# Patient Record
Sex: Female | Born: 1970
Health system: Southern US, Community
[De-identification: ages and names within clinical notes are randomized; demographics above are authoritative.]

## PROBLEM LIST (undated history)

## (undated) DIAGNOSIS — K219 Gastro-esophageal reflux disease without esophagitis: Secondary | ICD-10-CM

## (undated) DIAGNOSIS — C569 Malignant neoplasm of unspecified ovary: Secondary | ICD-10-CM

## (undated) DIAGNOSIS — F419 Anxiety disorder, unspecified: Secondary | ICD-10-CM

## (undated) HISTORY — PX: ABDOMINAL HYSTERECTOMY: SHX81

## (undated) HISTORY — PX: CHOLECYSTECTOMY: SHX55

## (undated) HISTORY — PX: APPENDECTOMY: SHX54

## (undated) HISTORY — DX: Malignant neoplasm of unspecified ovary: C56.9

---

## 2003-02-03 ENCOUNTER — Encounter: Admission: RE | Admit: 2003-02-03 | Discharge: 2003-03-19 | Payer: Self-pay | Admitting: Orthopedic Surgery

## 2004-09-16 ENCOUNTER — Other Ambulatory Visit: Admission: RE | Admit: 2004-09-16 | Discharge: 2004-09-16 | Payer: Self-pay | Admitting: Family Medicine

## 2008-10-06 ENCOUNTER — Emergency Department (HOSPITAL_COMMUNITY): Admission: EM | Admit: 2008-10-06 | Discharge: 2008-10-06 | Payer: Self-pay | Admitting: Emergency Medicine

## 2008-10-10 ENCOUNTER — Ambulatory Visit: Admission: RE | Admit: 2008-10-10 | Discharge: 2008-10-10 | Payer: Self-pay | Admitting: Gynecology

## 2008-10-28 ENCOUNTER — Inpatient Hospital Stay (HOSPITAL_COMMUNITY): Admission: RE | Admit: 2008-10-28 | Discharge: 2008-10-30 | Payer: Self-pay | Admitting: Obstetrics & Gynecology

## 2008-10-28 ENCOUNTER — Encounter (INDEPENDENT_AMBULATORY_CARE_PROVIDER_SITE_OTHER): Payer: Self-pay | Admitting: Gynecologic Oncology

## 2008-11-18 ENCOUNTER — Ambulatory Visit (HOSPITAL_COMMUNITY): Admission: RE | Admit: 2008-11-18 | Discharge: 2008-11-18 | Payer: Self-pay | Admitting: Gynecologic Oncology

## 2008-11-18 ENCOUNTER — Ambulatory Visit: Admission: RE | Admit: 2008-11-18 | Discharge: 2008-11-18 | Payer: Self-pay | Admitting: Gynecologic Oncology

## 2008-11-24 ENCOUNTER — Ambulatory Visit (HOSPITAL_COMMUNITY): Admission: RE | Admit: 2008-11-24 | Discharge: 2008-11-24 | Payer: Self-pay | Admitting: Gynecologic Oncology

## 2009-06-30 ENCOUNTER — Ambulatory Visit (HOSPITAL_COMMUNITY): Admission: RE | Admit: 2009-06-30 | Discharge: 2009-06-30 | Payer: Self-pay | Admitting: Internal Medicine

## 2009-11-24 IMAGING — CR DG CHEST 2V
2 series · 2 of 2 positions shown · non-contrast
Comparison: None.

CLINICAL DATA: Preop evaluation for pelvic mass.

CHEST - 2 VIEW

[w chest pa]
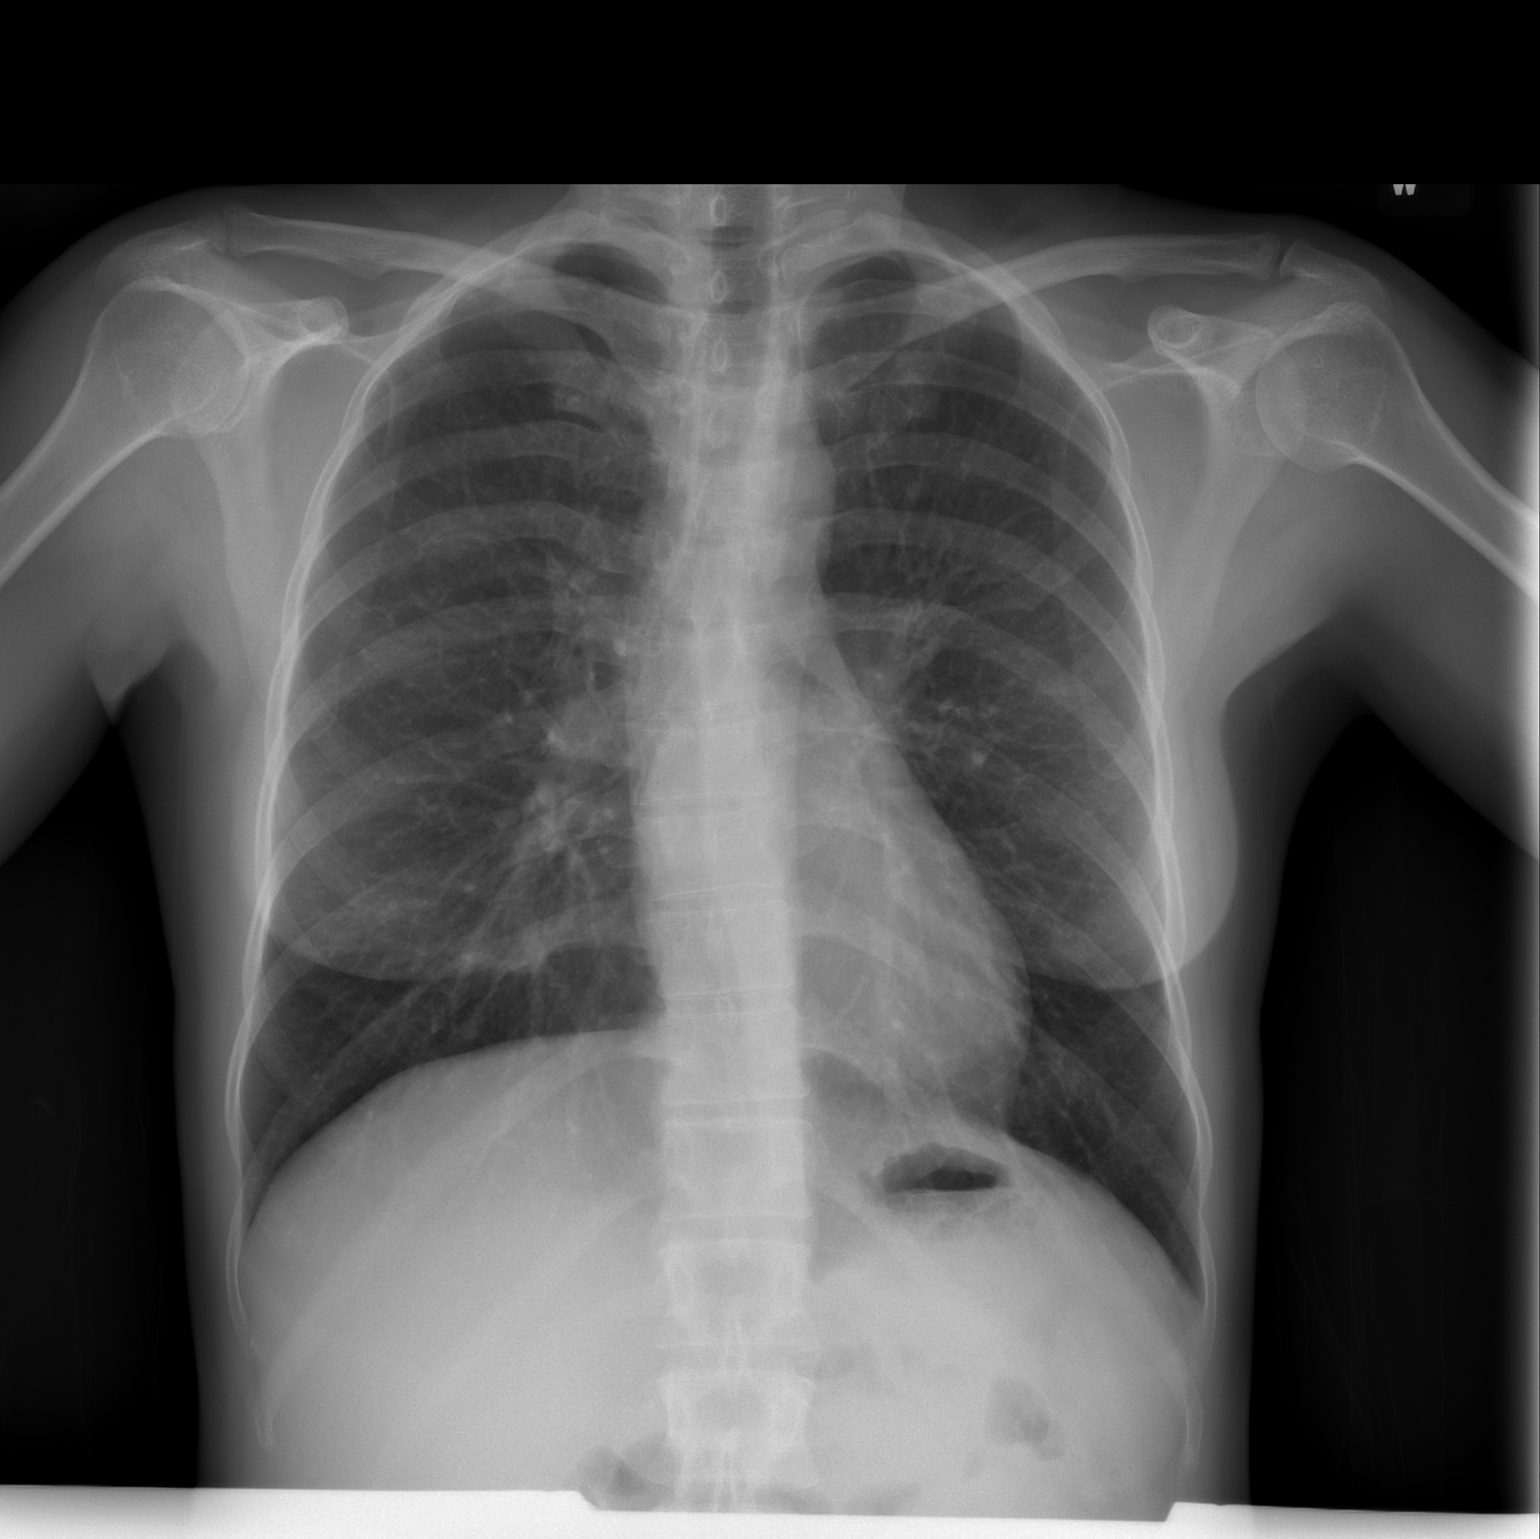

[w chest lat]
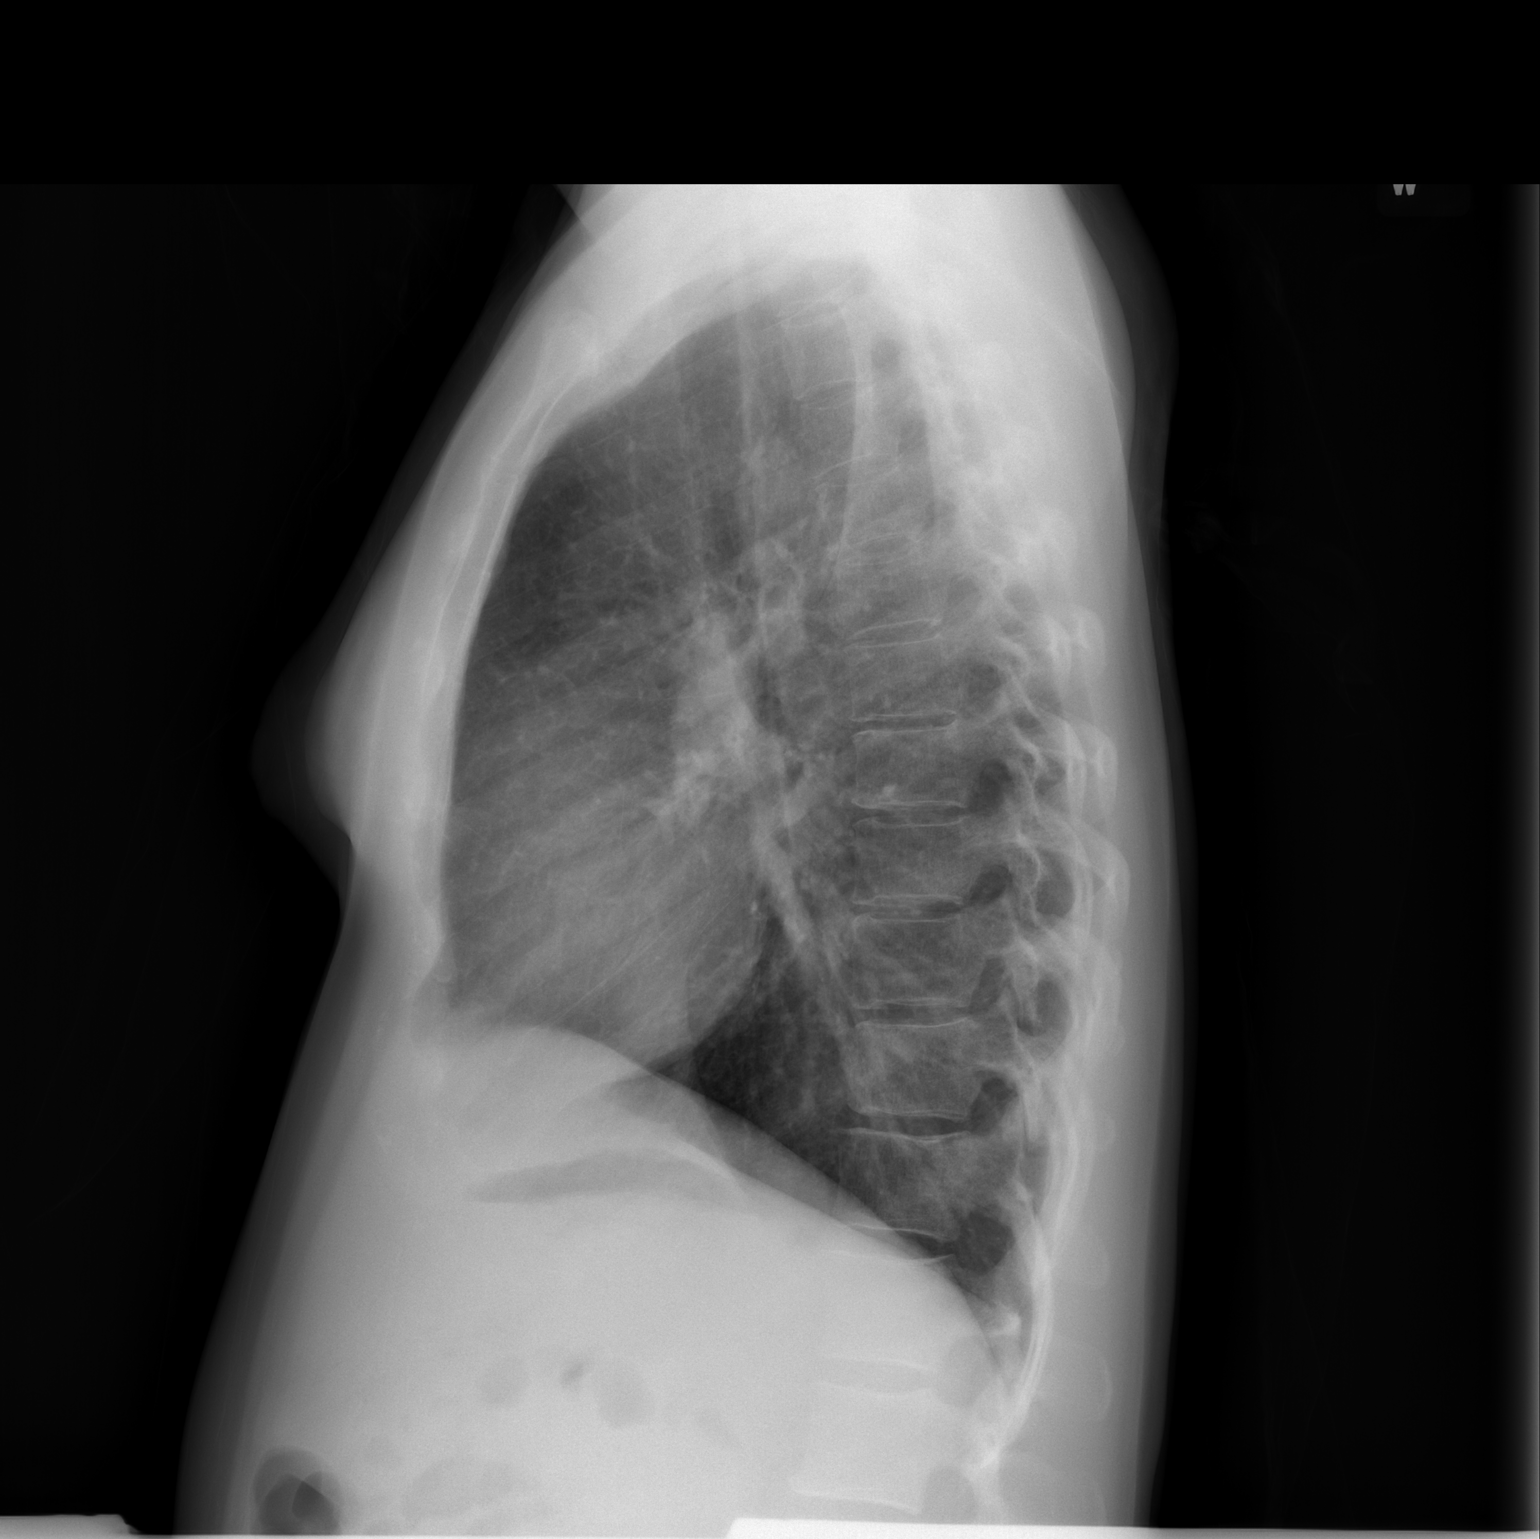

[2 of 2 positions shown; findings below may reference images not displayed]

FINDINGS: The lungs are clear without focal consolidation, edema,
effusion or pneumothorax.  Cardiopericardial silhouette is within
normal limits for size.  Imaged bony structures of the thorax are
intact.
IMPRESSION: No acute cardiopulmonary process.

## 2009-12-21 IMAGING — US US PELVIS COMPLETE
1 series · 14 of 16 positions shown · non-contrast
Comparison: None

CLINICAL DATA: Recent hysterectomy 10/28/2008 for abdominal pelvic
mass demonstrating adenocarcinoma on frozen section, pelvic
pressure, clinical concern for fluid collection

TRANSABDOMINAL ULTRASOUND OF PELVIS
TECHNIQUE: Transabdominal ultrasound examination of the pelvis was
performed including evaluation of the uterus, ovaries, adnexal
regions, and pelvic cul-de-sac.

[Series 1: unknown · 0.27mm/px · 14 of 16 slices shown]
[im 1/16]
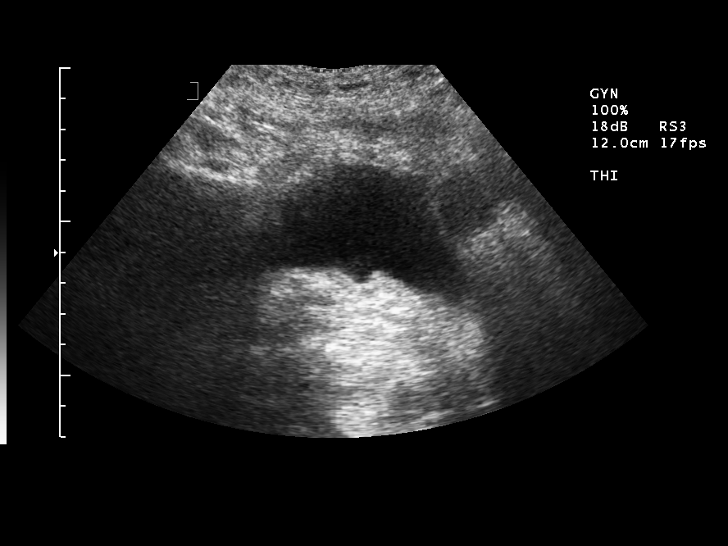
[im 2/16]
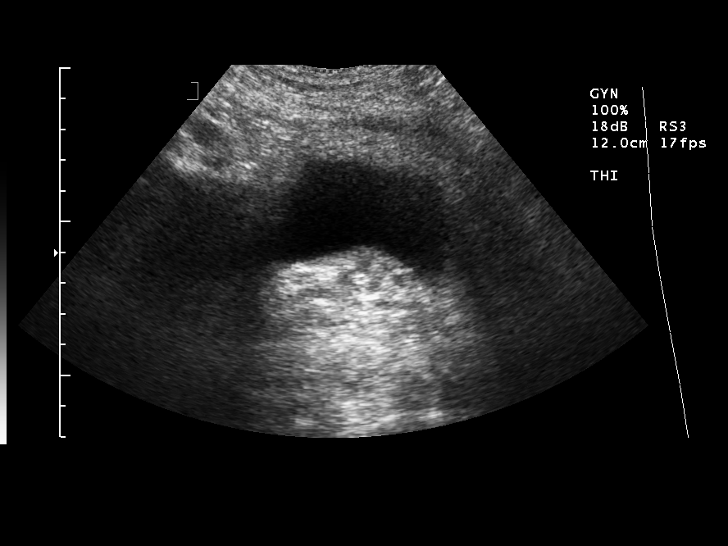
[im 3/16]
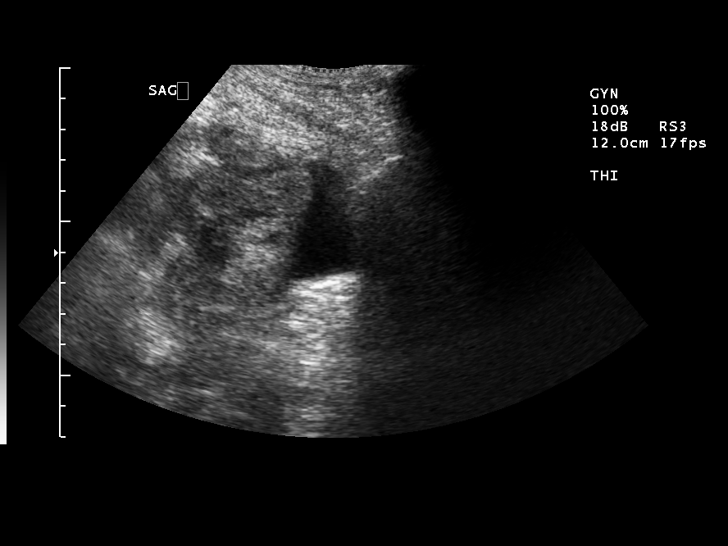
[im 5/16]
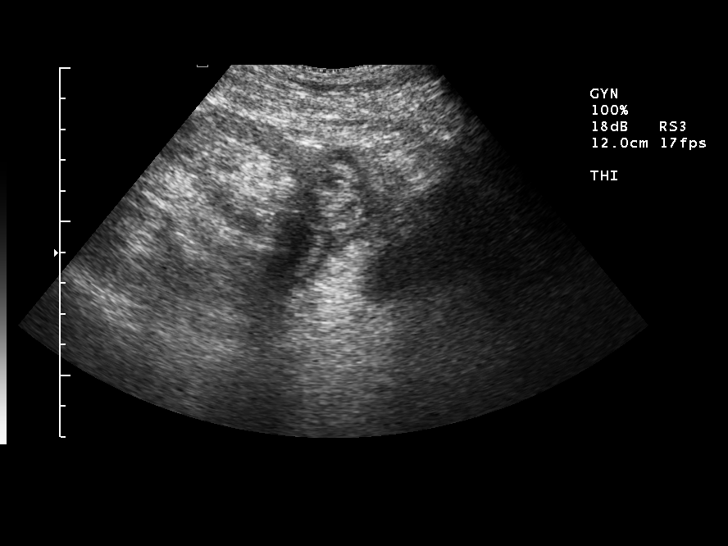
[im 6/16]
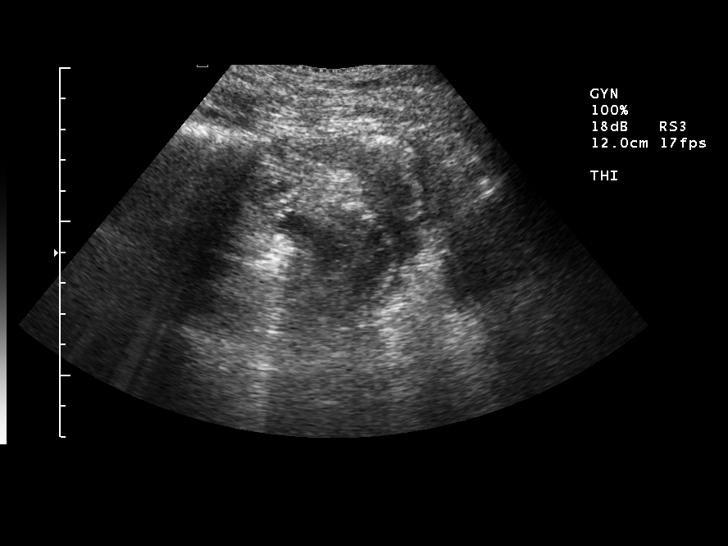
[im 7/16]
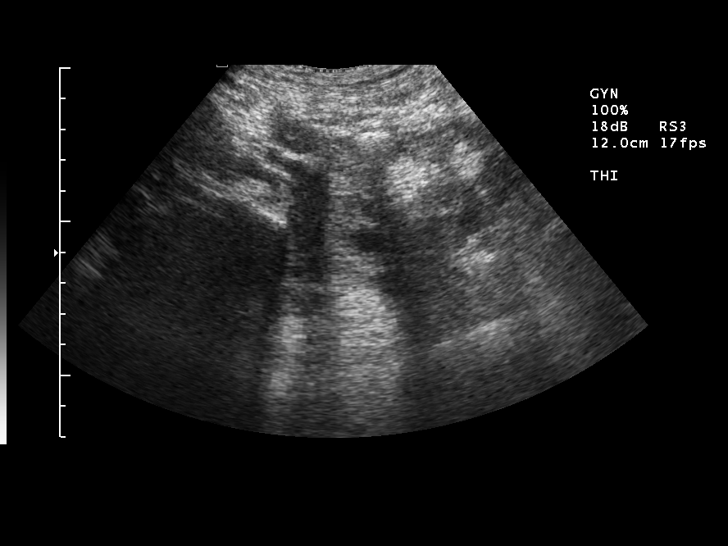
[im 8/16]
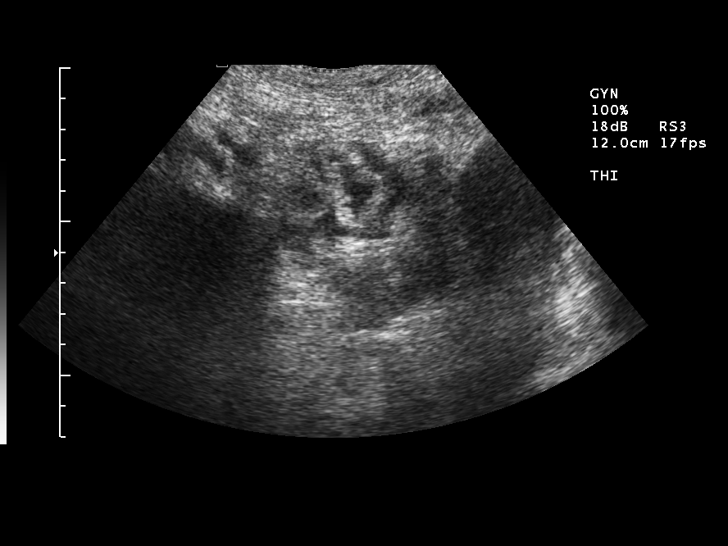
[im 9/16]
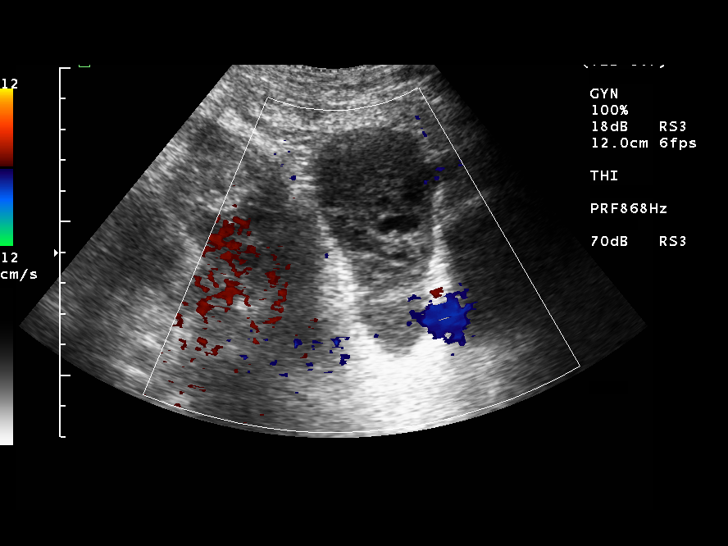
[im 10/16]
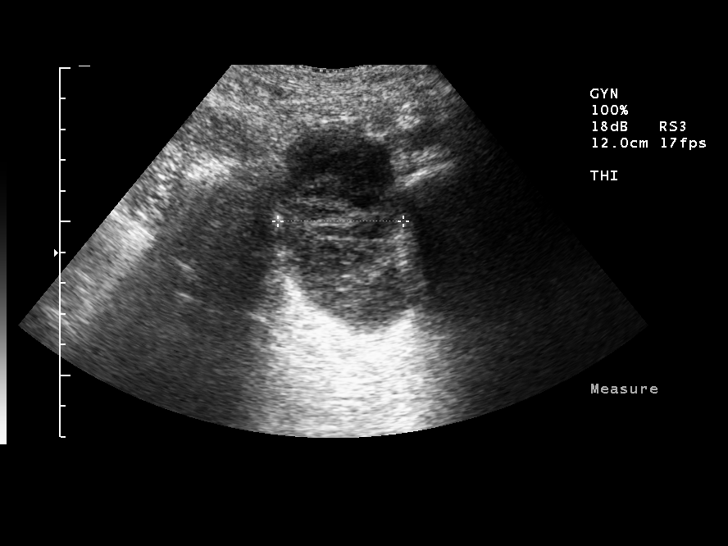
[im 11/16]
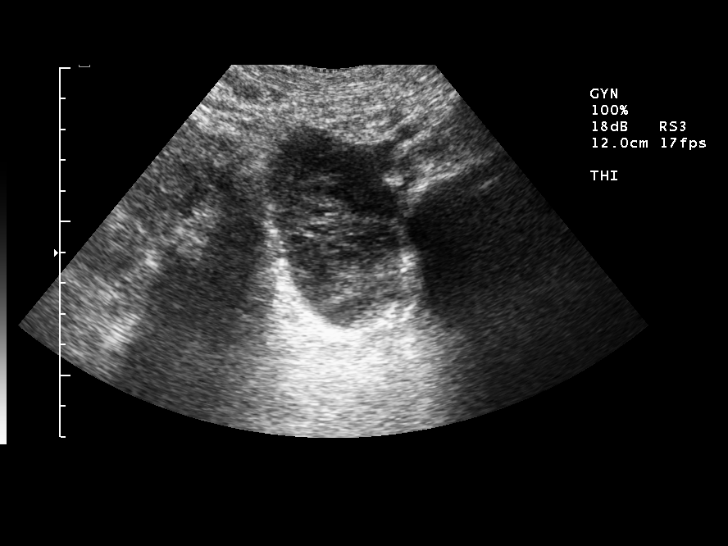
[im 13/16]
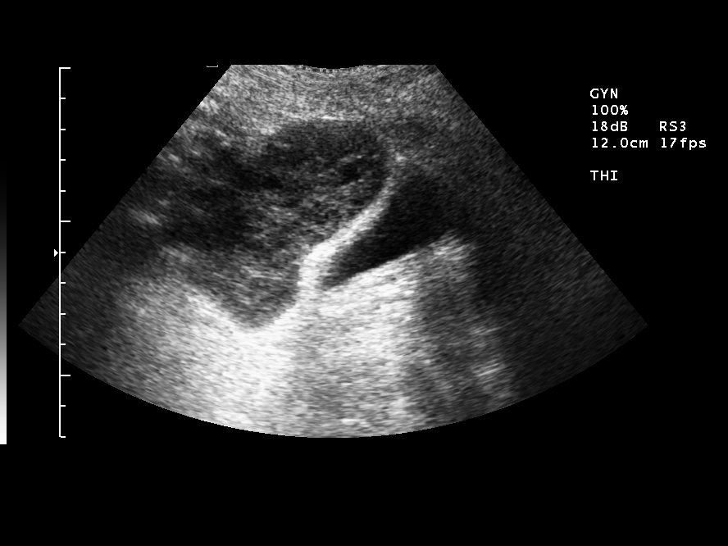
[im 14/16]
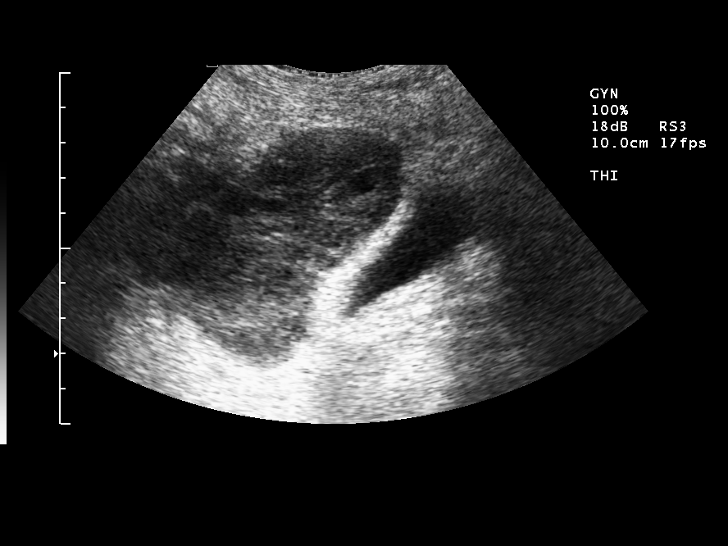
[im 15/16]
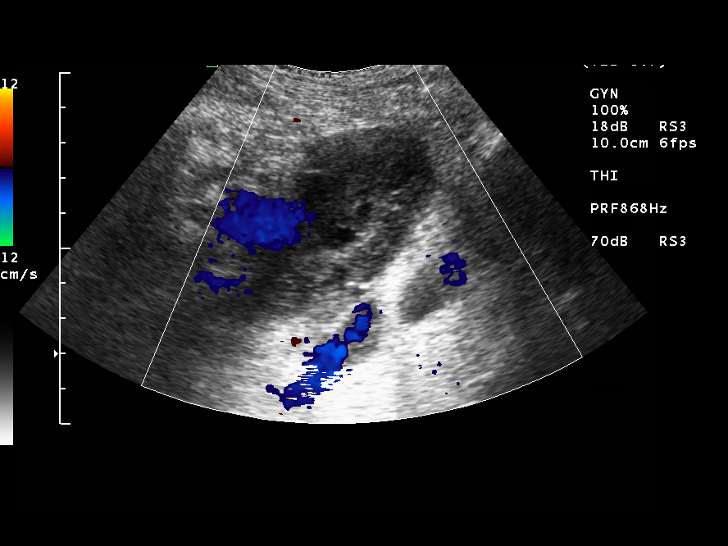
[im 16/16]
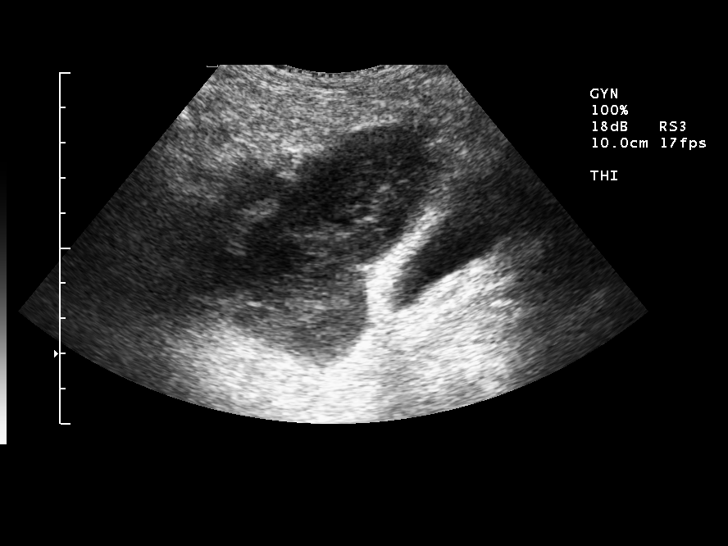

[14 of 16 positions shown; findings below may reference images not displayed]

FINDINGS: No free fluid is identified.  The uterus is surgically
absent.  Neither ovary is identified. There is a 7.6 x 4.1 x 3.7 cm
left adnexal mass which is internally heterogeneous, circumscribed,
with increased through transmission.  No internal color flow is
seen.
IMPRESSION: Left adnexal mass lesion which does not have a typical appearance
for simple postoperative seroma/hematoma or abscess formation.
Given the history of adenocarcinoma, this raises the question of
residual disease.  Complex fluid could have this appearance if it
had undergone interval internal organization since prior surgery.
Consider CT pelvis with IV contrast for further evaluation and to
assist consideration of possible percutaneous drainage, if
clinically indicated.

## 2010-08-02 IMAGING — PT NM PET TUM IMG RESTAG (PS) SKULL BASE T - THIGH
6 series · 25 of 25 positions shown · non-contrast
Comparison: 11/24/2008

CLINICAL DATA: Subsequent treatment strategy for ovarian.

NUCLEAR MEDICINE PET CT INITIAL (PI) SKULL BASE TO THIGH
TECHNIQUE: 15.4 mCi F-18 FDG was injected intravenously via the
right antecubital fossa.  Full-ring PET imaging was performed from
the skull base through the mid-thighs 69  minutes after injection.
CT data was obtained and used for attenuation correction and
anatomic localization only.  (This was not acquired as a diagnostic
CT examination.)
Fasting Blood Glucose:  89

[Series 1: pet ac · axial · 3.3mm · 4.69mm/px · z∈[-850,+20]mm · 5 of 267 slices shown]
[im 1/267]
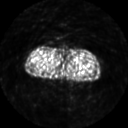
[im 67/267]
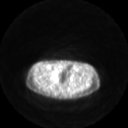
[im 134/267]
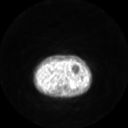
[im 200/267]
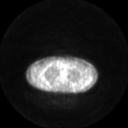
[im 267/267]
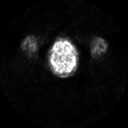

[Series 2: ct images · axial · 3.8mm · 0.98mm/px · z∈[-850,+20]mm · 6 of 267 slices shown]
[im 1/267]
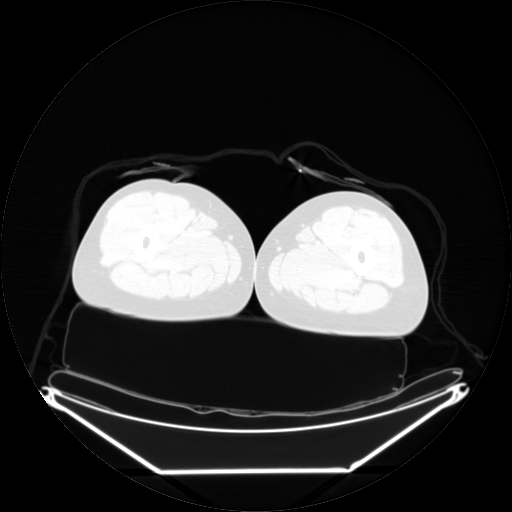
[im 54/267]
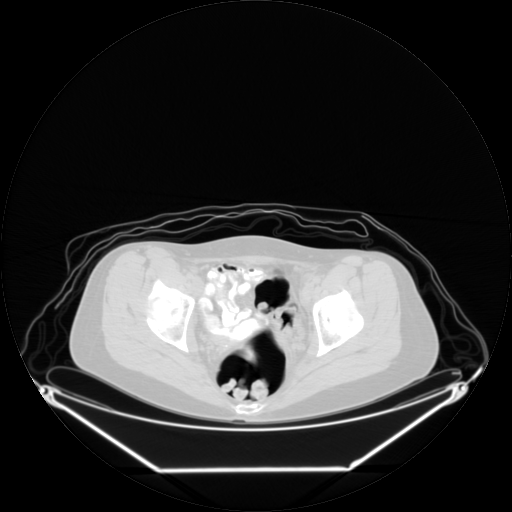
[im 107/267]
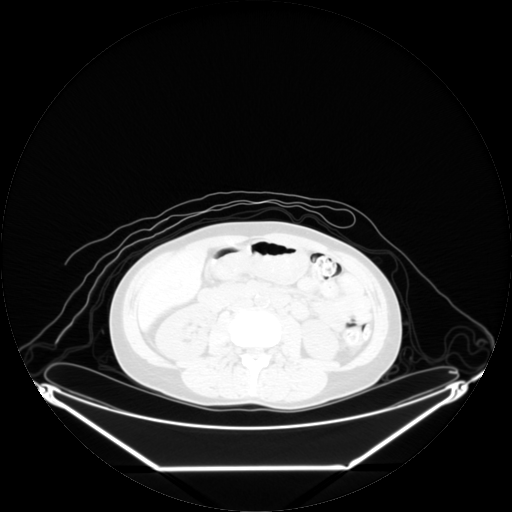
[im 160/267]
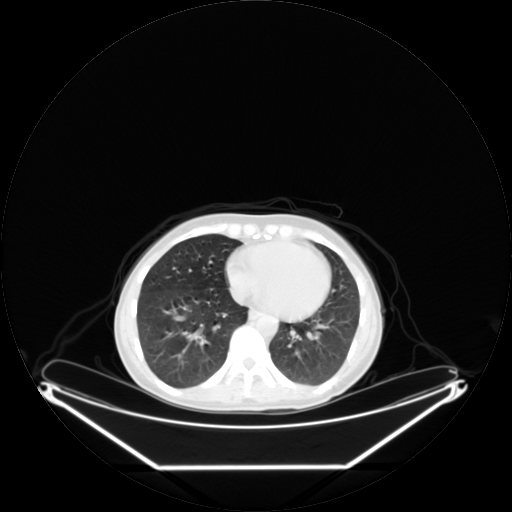
[im 213/267]
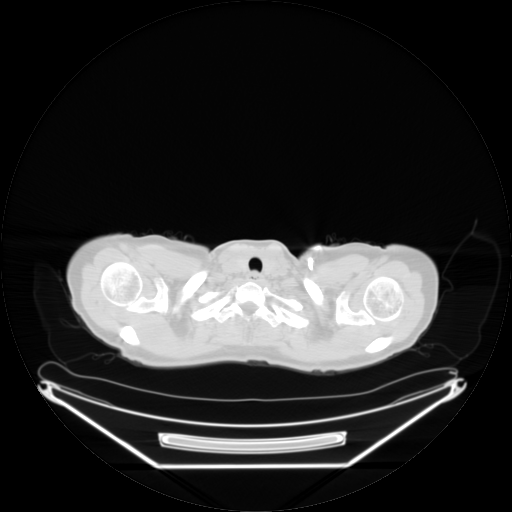
[im 267/267  brain]
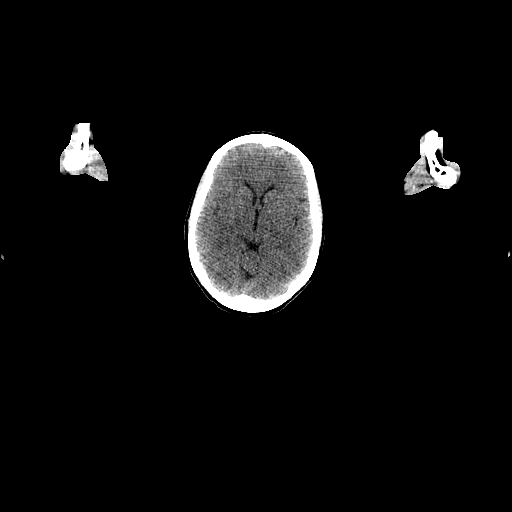

[Series 2: pet nac · axial · 3.3mm · 4.69mm/px · z∈[-850,+20]mm · 6 of 267 slices shown]
[im 1/267]
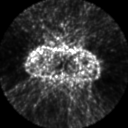
[im 54/267]
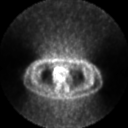
[im 107/267]
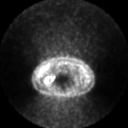
[im 160/267]
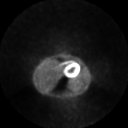
[im 213/267]
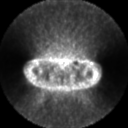
[im 267/267]
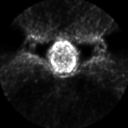

[Series 123: mip · coronal · 3.3mm · 4.69mm/px · 1 of 30 slices shown]
[im 1/30]
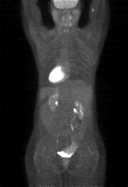

[Series 151: reformatted · axial · 3.3mm · 3.91mm/px · z∈[-850,+20]mm · 6 of 265 slices shown (1 of 2)]
[im 1/265]
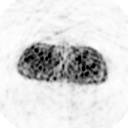
[im 53/265]
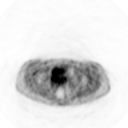
[im 106/265]
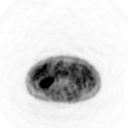
[im 159/265]
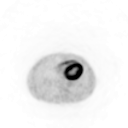
[im 212/265]
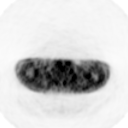
[im 265/265]
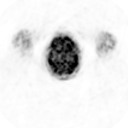

[Series 153: reformatted · coronal · 4.7mm · 6.98mm/px · 1 of 61 slices shown (2 of 2)]
[im 1/61]
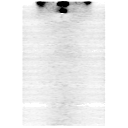

[25 of 25 positions shown; findings below may reference images not displayed]

FINDINGS: No hypermetabolic lymph nodes within the neck.

There are no hypermetabolic supraclavicular or axillary lymph
nodes.

No hypermetabolic mediastinal or hilar lymph nodes identified.

There is no pericardial or pleural effusion.

Tiny nodule in the right upper lobe measures approximately 4 mm,
image 68.  This is too small to characterize.

No hypermetabolic pulmonary nodules or masses noted.

There is no abnormal FDG uptake identified within the liver
parenchyma.

No hypermetabolic uptake within the spleen.

Adrenal glands are negative.

There are no hypermetabolic retroperitoneal or mesenteric lymph
nodes.

No upper abdominal ascites identified.

There is no hypermetabolic peritoneal nodularity within the upper
abdomen.

The previously noted low density fluid collections within the left
hemi pelvis and dependent portion the pelvis have resolved in the
interval.
IMPRESSION: 1.  There is no evidence for residual or recurrent hypermetabolic
tumor.

## 2010-12-19 ENCOUNTER — Encounter (HOSPITAL_COMMUNITY): Payer: Self-pay | Admitting: Internal Medicine

## 2010-12-20 ENCOUNTER — Encounter (HOSPITAL_COMMUNITY): Payer: Self-pay | Admitting: Internal Medicine

## 2011-04-12 NOTE — Consult Note (Signed)
NAMEAUTUMN, PRUITT NO.:  [id number]   MEDICAL RECORD NO.:  [id number]          PATIENT TYPE:  OUT   LOCATION:  GYN                          FACILITY:  [facility]   PHYSICIAN:  De Blanch, M.D.DATE OF BIRTH:  06/11/1971   DATE OF CONSULTATION:  10/10/2008  DATE OF DISCHARGE:                                 CONSULTATION   CHIEF COMPLAINT:  Pelvic mass.   HISTORY OF PRESENT ILLNESS:  A 40 year old white female seen in  consultation at the request of Dr. Ubaldo Glassing regarding management of a  large abdominal pelvic mass.  The patient has had abdominal symptoms for  several months and ultimately presented to the emergency department at  [facility] with pain and fatigue.  A mass was discovered  and evaluated with CT scan which shows bilateral hydronephrosis the  right being greater than the left and a 17 x 19 x 9 cm abdominal pelvic  mass extending to above the umbilicus appearing to originate from the  right adnexal region.  It is felt that the hydronephrosis is secondary  to extrinsic compression.  There is no evidence of bowel obstruction and  ascites cannot be fully determined because of the size of the mass.  There is a small inguinal lymph node measuring less than a centimeter  and a left external iliac lymph node which is 1 cm.   CA-125 is 16.7.  Alpha fetoprotein 4.3.  Serum pregnancy test is  negative.  Remainder of her laboratory tests include a metabolic panel  and complete blood count are normal.  Hemoglobin is 14.2.   PAST MEDICAL HISTORY:  Medical illnesses COPD/asthma, gastroesophageal  reflux disease.   DRUG ALLERGIES:  PENICILLIN (hives), CODEINE (nausea and vomiting), IV  CONTRAST.   PAST SURGICAL HISTORY:  Cesarean section, resection of vaginal septum,  bilateral tubal ligation, cold knife conization.   OBSTETRICAL HISTORY:  Gravida 4.   FAMILY HISTORY:  The patient has an aunt with colon cancer.   SOCIAL HISTORY:  The  patient is separated for many years.  She smokes  one pack per day.  She lives with her young adult daughter.   PHYSICAL EXAM:  VITAL SIGNS:  Height 5 feet 1 inch, weight 125 pounds.  GENERAL:  The patient is a slender white female in no acute distress.  HEENT:  Negative.  NECK:  Supple without thyromegaly.  There is no supraclavicular or  inguinal adenopathy.  ABDOMEN:  The abdomen is soft and nontender.  There is a mass extending  to several centimeters above the umbilicus which is slightly tender to  palpation.  I am unable to detect any ascites or shifting dullness.  PELVIC:  EG/BUS, vagina, bladder and urethra are normal.  Cervix appears  normal.  Uterus is anterior and separate from the mass.  No other masses  are noted.  There is no cul-de-sac nodularity.   IMPRESSION:  Large complex abdominal pelvic mass of questionable  etiology.  I advised the patient and her daughter that surgical  exploration and resection with intraoperative frozen section was most  appropriate mode of management.  They are in agreement with going ahead  with surgery which we will schedule to be performed by Dr. Ronita Hipps on  October 28, 2008.  The risks of surgery were discussed.  In addition, we  discussed that if this is a malignancy she will undergo a TAH-BSO,  omentectomy, pelvic and periaortic lymphadenectomy and multiple  peritoneal biopsies and other debulking as necessary.  She is aware that  if her hydronephrosis is not relieved she may require ureteral stenting.   Further she understands that if she does have a malignancy chemotherapy  may be necessary to advise.  On the other hand if this is benign the  patient would like to preserve her other ovary if it is normal and at  this juncture does not wish to have a hysterectomy either.      De Blanch, M.D.  Electronically Signed     DC/MEDQ  D:  10/10/2008  T:  10/11/2008  Job:  [id number]   cc:   Lebron Conners. Darovsky, M.D.  Fax:  [phone]   Telford Nab, R.N.  [address]  [place], [place] [address]   Su Monks

## 2011-04-12 NOTE — Consult Note (Signed)
[name], [name]               ACCOUNT NO.:  [id number]   MEDICAL RECORD NO.:  [id number]          PATIENT TYPE:  OUT   LOCATION:  XRAY                         FACILITY:  [facility]   PHYSICIAN:  Paola A. Duard Brady, MD    DATE OF BIRTH:  [birth year]   DATE OF CONSULTATION:  11/18/2008  DATE OF DISCHARGE:                                 CONSULTATION   The patient is a 40 year old who underwent exploratory laparotomy TAH-  BSO and appropriate staging October 28, 2008 with Dr. 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  She comes in  today for a postoperative check.  At the time of her surgery, there was  intraoperative spill of the mass was very sick mucinous fluid.  The  bowel was run and was completely negative.  She had a normal appendix.  She is set to see Dr. Ubaldo Glassing for consideration of adjuvant  chemotherapy.  She comes in today for her postoperative check.  She does  complain of some heaviness with voiding.  She states her urine is  somewhat cloudy . She denies any specific dysuria or burning.  She just  states it hurts in her vagina and her rectum, particularly when she is  sitting or sneezing.  She does have some mild nausea in the morning.  She has significant issues with heartburn.  She has gastroesophageal  reflux disease and was supposed to be a AcipHex but has not been able to  be on it as she has lost her insurance.  Now that she is out of  insurance again, she merely does not have a prescription.  She does pass  gas and had regular bowel movements without any significant issues.  She  is otherwise doing quite well.  Denies any fevers or chills.   PHYSICAL EXAMINATION:  CONSTITUTIONAL:  As a well-nourished, alert  female in no acute distress.  VITAL SIGNS:  Weight is 114 pounds down from 125 pounds.  ABDOMEN:  Shows well-healed transverse skin incision.  Abdomen is soft.  PELVIC:  External genitalia is within normal limits.  Vagina is well  epithelialized.  The vaginal cuff is visualized.  There appears to be  some bulging of the vaginal cuff.  It is closed.  Sutures are not  visible.  On bimanual examination there is a mass within the pelvis that  is somewhat tender.  I feel it more posteriorly than anteriorly.  I  cannot appreciate on abdominal exam but on bimanual examination appears  to be 6 to 8 cm.   ASSESSMENT:  64. A 40 year old with a 1C grade 2 endometrioid adenocarcinoma of the      ovary with postoperative masses that could very well just be a      benign fluid collection or seroma.  Will see if radiology could      perform an abdominal ultrasound today.  If they do see a mass, will      see if it can be drained and fluid sent for culture.  2. Will follow up the results of her urine culture  that was sent      today.  3. I will start her on Aciphex.  4. The patient has an appointment with Dr. Ubaldo Glassing tomorrow for      discussion of chemotherapy.  I would expect that she would receive      6 cycles of paclitaxel and carboplatin.      Paola A. Duard Brady, MD  Electronically Signed     PAG/MEDQ  D:  11/18/2008  T:  11/18/2008  Job:  [id number]   cc:   Lebron Conners. Darovsky, M.D.  Fax: [phone]   Telford Nab, R.N.  [address]  [place], [place] [address]

## 2011-04-12 NOTE — Op Note (Signed)
[name], [name]               ACCOUNT NO.:  [id number]   MEDICAL RECORD NO.:  [id number]          PATIENT TYPE:  INP   LOCATION:  1535                         FACILITY:  [facility]   PHYSICIAN:  John T. Kyla Balzarine, M.D.    DATE OF BIRTH:  1971-10-16   DATE OF PROCEDURE:  10/28/2008  DATE OF DISCHARGE:                               OPERATIVE REPORT   SURGEON:  John T. Kyla Balzarine, M.D.   ASSISTANT:  Roseanna Rainbow, M.D.   PREOPERATIVE DIAGNOSIS:  Abdominopelvic mass.   POSTOPERATIVE DIAGNOSIS:  Ovarian carcinoma, at least stage IC.   PROCEDURE:  Exploratory laparotomy with total abdominal hysterectomy and  bilateral salpingo-oophorectomy, multiple peritoneal biopsies,  omentectomy, pelvic and para-aortic bilateral lymphadenectomy,  appendectomy, washings.   DESCRIPTION OF FINDINGS AND INDICATIONS FOR SURGERY:  This 39 year old  woman presented with pain and an 18 x 19 cm abdominopelvic mass.  On her  initial CT scan there was ureteral dilatation bilaterally, right greater  than left.  She was explored through a prior Pfannenstiel incision and a  smooth walled encapsulated tumor was encountered.  There was scant free  peritoneal fluid.  There were adhesions between omentum and sigmoid  colon to the capsule of the ovarian tumor.  We attempted drainage by  pursestringing the capsule of the ovary, puncturing the ovary and  draining the ovary but it contained very thick mucinous fluid and there  was some intra-abdominal spill.  Frozen section returned adenocarcinoma  likely mucinous type.  The bowel was run in its entirety with no focal  lesions and normal appendix.  There were no intraperitoneal lesions  suspicious for peritoneal metastasis, the omentum and viscera were  normal, and there were no pathologically enlarged lymph nodes.  Comprehensive staging was performed.   DESCRIPTION OF PROCEDURE:  Examination under anesthesia was performed  after the patient was prepped and draped  with an indwelling Foley  catheter in low lithotomy position using direct placement stirrups.  Exploration was carried out through a prior transverse skin incision  approximately three fingerbreadths above the symphysis.  The incision  was developed with scalpel and electrocautery, incising the fascia  transversely.  Superior and inferior fascial flaps were developed,  mobilizing fascia off the rectus muscles.  Electrocautery was used for  hemostasis.  The midline was developed bluntly, peritoneum tented up  between two tonsil clamps and entered with a scalpel.  The incision was  extended from symphysis to umbilicus and abdominal contents inspected,  revealing findings described above.  Washings were obtained  approximately 25 mL free fluid.  The large cyst was seen to replace the  right ovary.  We packed lap pads around the abdominal incision, placed a  pursestring suture of zero Vicryl and incised the capsule of the ovary,  draining only 500 mL of mucinous fluid that was encountered.  There was  spill before the ovary could be freed from adhesions to the omentum and  sigmoid colon and delivered through the abdominal incision.  The  peritoneum lateral to the infundibulopelvic ligament was opened, IP  ligament skeletonized, cross clamped, divided and ligated with  free tie  and suture ligature of 2-0 Vicryl.  The utero-ovarian ligament and  fallopian tube were likewise transected at the uterine cornua, suture  ligated and the specimen was handed off for frozen section pathology.  The pelvis was copiously irrigated.  Abdominal contents were manually  and visually inspected, revealing the findings described above.  When  frozen section returned positive for adenocarcinoma, we elected to  perform completion hysterectomy with the LSO and comprehensive staging,  because there were no gross lesions suspicious for metastatic disease.  The greater omentum was removed by mobilizing it off of the  colon and  developing pedicles that were cauterized with electrocautery.  Inspection revealed hemostasis along the omentum and this was submitted  for pathology.  The bowel was then packed out of the pelvis and a  Bookwalter retractor placed.  The uterus was grasped at the cornu with  curved clamps.  The round ligaments bilaterally were divided with  electrocautery.  On each side the posterior broad ligament was  completely opened.  On the right side the pararectal and paravesical  spaces were developed, right sidewall taken down with the uterine  arteries posteriorly and anteriorly, the peritoneal flap developed.  The  bladder was advanced off the lower uterine segment with sharp and blunt  dissection and electrocautery for hemostasis.  The uterine vessels were  skeletonized, cross clamped, divided and suture ligated with 2-0 Vicryl.  On the left side, the ureter was identified and left IP ligament was  skeletonized above the ureter, cross clamped, divided and ligated with  both free tie and suture ligature.  Posterior leaf of the broad ligament  was divided to the uterine vessels above the ureter.  The anterior  peritoneal reflection was opened and bladder flap completely developed.  The left uterine vessels were clamped, divided and suture ligated with 2-  0 Vicryl.  Alternating on the right and left sides additional cardinal  ligament pedicles were developed by clamping adjacent to the cervix,  dividing and suture ligating with 2-0 Vicryl.  Final pedicles entered  the lateral fornices of the vagina and the specimen was amputated,  removing the entire cervix along with the uterus and left tube and  ovary.  The vaginal cuff was closed with locked running zero Vicryl.  Multiple peritoneal biopsies were obtained from the pelvis including  anterior and posterior cul-de-sac, right and left pelvic sidewall; these  specimens were submitted for pathology.   The right and left rectal and  paravesical spaces were fully developed.  Bilateral pelvic lymphadenectomies were performed, initially on the left  and subsequently on the right.  All lymphatic tissue anterior and medial  to the external iliac artery and vein were cleared from the vessels  using sharp and blunt dissection with electrocautery for hemostasis.  Dissection continued along the hypogastric artery into the obturator  space, removing all lymphatic tissue anterior to the obturator nerve.  The dissection continued distally at all locations to the level of the  deep circumflex iliac vein.  Moist lap pads were placed in the  retroperitoneal spaces.   Peritoneum overlying the root of the aorta was opened and using sharp  and blunt dissection, retroperitoneum developed.  The right ureter was  mobilized laterally and lymphatic tissue anterior and lateral to the  right common iliac artery and aorta was mobilized off of the veins to  the psoas muscle laterally.  The dissection extended above the  bifurcation of the aorta to approximately the level of  the duodenum and  insertion of the right ovarian vein.  On the left side, retroperitoneal  spaces were developed, ureter identified and retracted anterolaterally.  The lymphatic tissue anterior and lateral to the distal common iliac  artery distally to the common iliac artery and proximally to the level  of the IMA were cleared off of the great vessels using sharp and blunt  dissection with electrocautery for hemostasis.  The operative sites were  hemostatic.  The cecum was mobilized and peritoneal biopsies were  obtained from the right and left pericolic gutters and blind biopsies  from the mesentery of the distal small bowel.  Incidental appendectomy  was performed because of the association between mucinous carcinomas of  the ovary and occult appendiceal malignancies.  The mesentery of the  appendix was developed into pedicles that were controlled with  electrocautery.   The base of the appendix was crushed between two  clamps, divided with the scalpel and controlled with a transfixing  suture.  Copious irrigation was performed.  The diaphragms were each  biopsied using a long parametrial clamp to strip the peritoneum from  each diaphragm.  At the conclusion of the procedure, all lap pads and  sponges were removed and operative sites inspected for hemostasis;  additional hemostasis was achieved where necessary with electrocautery.  The abdominal wall was closed in layers using running 2-0 Vicryl to  reapproximate the posterior sheath, peritoneum and rectus bellies in the  midline, running zero Vicryl to close fascia and running subcuticular 3-  0 Vicryl to close the skin.  The patient tolerated the procedure well  and was returned to the recovery room in stable condition.   ESTIMATED BLOOD LOSS:  150 mL.   TRANSFUSIONS:  None.   DRAINS AND PACKS:  Foley to dependent drainage.   COUNTS:  Sponge and instrument counts correct x2.   PATHOLOGY SPECIMENS:  Peritoneal washings, uterus with cervix, bilateral  tubes and ovaries, right and left pelvic sidewall biopsies, anterior and  posterior cul-de-sac biopsies, right and left pelvic lymph nodes, right  and left common iliac lymph nodes, right and left aortic lymph nodes,  right and left paracolic gutter peritoneum, mesentery of small  intestine, appendix, right and left diaphragms.      John T. Kyla Balzarine, M.D.  Electronically Signed     JTS/MEDQ  D:  10/28/2008  T:  10/29/2008  Job:  [id number]   cc:   Telford Nab, R.N.  [address]  [place], [place] [address]   Roseanna Rainbow, M.D.  Fax: [phone]

## 2011-04-15 NOTE — Discharge Summary (Signed)
NAMEBEYLA, [name]               ACCOUNT NO.:  [id number]   MEDICAL RECORD NO.:  [id number]          PATIENT TYPE:  INP   LOCATION:  1535                         FACILITY:  [facility]   PHYSICIAN:  Roseanna Rainbow, M.D.DATE OF BIRTH:  16-Oct-1971   DATE OF ADMISSION:  10/28/2008  DATE OF DISCHARGE:  10/30/2008                               DISCHARGE SUMMARY   CHIEF COMPLAINT:  The patient is a 40 year old who presents for  operative management of a large abdominal pelvic mass.  Please see the  dictated history and physical.   HOSPITAL COURSE:  The patient was admitted.  She underwent a total  abdominal hysterectomy, bilateral salpingo-oophorectomy, omentectomy,  multiple biopsies, pelvic and periaortic lymph lymphadenectomies, and an  appendectomy.  Please see the dictated operative summary.  On  postoperative day 1, her hemoglobin was 11.5.  She was complaining of a  cough and chest tightness.  The patient has a history of COPD.  Respiratory therapy was ordered.  She was started on Zithromax p.o.  Her  diet was gradually advanced.  She was subsequently discharged to home.   DISCHARGE DIAGNOSIS:  Ovarian adenocarcinoma, stage IC.   PROCEDURES:  1. Total abdominal hysterectomy and bilateral salpingo-oophorectomy.  2. Omentectomy.  3. Multiple biopsies.  4. Pelvic and periaortic lymphadenectomies.  5. Appendectomy.   CONDITION:  Stable.   DIET:  Regular.   ACTIVITY:  Progressive activity, pelvic rest.   MEDICATIONS:  Include:  1. Zithromax.  2. Percocet.   DISPOSITION:  The patient was to follow up with Dr. Stanford Breed on  November 25, 2008, at the GYN oncology office at 1 p.m.      Roseanna Rainbow, M.D.  Electronically Signed     LAJ/MEDQ  D:  12/09/2008  T:  12/09/2008  Job:  [id number]   cc:   Telford Nab, R.N.  [address]  [place], [place] [address]   Dr. _____  Fax: [phone]   Dr. Molly Maduro _____

## 2011-08-30 LAB — COMPREHENSIVE METABOLIC PANEL
AST: 14 U/L (ref 0–37)
Albumin: 2.7 g/dL — ABNORMAL LOW (ref 3.5–5.2)
Calcium: 8.9 mg/dL (ref 8.4–10.5)
Creatinine, Ser: 0.55 mg/dL (ref 0.4–1.2)
GFR calc Af Amer: >60 mL/min (ref >60)
GFR calc non Af Amer: >60 mL/min (ref >60)

## 2011-08-30 LAB — DIFFERENTIAL
Basophils Absolute: 0.8 10*3/uL — ABNORMAL HIGH (ref 0.0–0.1)
Basophils Relative: 5 % — ABNORMAL HIGH (ref 0–1)
Eosinophils Absolute: 0.2 10*3/uL (ref 0.0–0.7)
Eosinophils Relative: 1 % (ref 0–5)
Monocytes Absolute: 0.5 10*3/uL (ref 0.1–1.0)

## 2011-08-30 LAB — CBC
MCHC: 33.3 g/dL (ref 30.0–36.0)
MCV: 92.1 fL (ref 78.0–100.0)
Platelets: 658 10*3/uL — ABNORMAL HIGH (ref 150–400)
RDW: 12.9 % (ref 11.5–15.5)

## 2011-08-30 LAB — URINALYSIS, ROUTINE W REFLEX MICROSCOPIC
Glucose, UA: NEGATIVE mg/dL
Hgb urine dipstick: NEGATIVE
Ketones, ur: NEGATIVE mg/dL
Protein, ur: NEGATIVE mg/dL
pH: 6.5 (ref 5.0–8.0)

## 2011-09-02 LAB — URINALYSIS, ROUTINE W REFLEX MICROSCOPIC
Bilirubin Urine: NEGATIVE
Glucose, UA: NEGATIVE mg/dL
Hgb urine dipstick: NEGATIVE
Ketones, ur: NEGATIVE mg/dL
Nitrite: NEGATIVE
Specific Gravity, Urine: 1.016 (ref 1.005–1.030)
pH: 7 (ref 5.0–8.0)

## 2011-09-02 LAB — URINE CULTURE
Colony Count: NO GROWTH
Culture: NO GROWTH

## 2011-09-02 LAB — DIFFERENTIAL
Basophils Absolute: 0.1 10*3/uL (ref 0.0–0.1)
Eosinophils Relative: 1 % (ref 0–5)
Lymphocytes Relative: 16 % (ref 12–46)
Monocytes Absolute: 0.6 10*3/uL (ref 0.1–1.0)
Monocytes Relative: 5 % (ref 3–12)
Neutro Abs: 8.8 10*3/uL — ABNORMAL HIGH (ref 1.7–7.7)

## 2011-09-02 LAB — BASIC METABOLIC PANEL
BUN: 4 mg/dL — ABNORMAL LOW (ref 6–23)
Calcium: 8.3 mg/dL — ABNORMAL LOW (ref 8.4–10.5)
GFR calc non Af Amer: >60 mL/min (ref >60)
Potassium: 4.4 mEq/L (ref 3.5–5.1)
Sodium: 133 mEq/L — ABNORMAL LOW (ref 135–145)

## 2011-09-02 LAB — CBC
HCT: 34.3 % — ABNORMAL LOW (ref 36.0–46.0)
HCT: 36 % (ref 36.0–46.0)
Hemoglobin: 11.5 g/dL — ABNORMAL LOW (ref 12.0–15.0)
Hemoglobin: 11.8 g/dL — ABNORMAL LOW (ref 12.0–15.0)
Platelets: 631 10*3/uL — ABNORMAL HIGH (ref 150–400)
RBC: 3.88 MIL/uL (ref 3.87–5.11)
RDW: 13.7 % (ref 11.5–15.5)
RDW: 13.8 % (ref 11.5–15.5)
WBC: 16.1 10*3/uL — ABNORMAL HIGH (ref 4.0–10.5)

## 2011-09-02 LAB — TYPE AND SCREEN: Antibody Screen: NEGATIVE

## 2011-09-02 LAB — ABO/RH: ABO/RH(D): B POS

## 2012-05-04 ENCOUNTER — Encounter: Payer: Self-pay | Admitting: Internal Medicine

## 2012-05-04 DIAGNOSIS — Z5111 Encounter for antineoplastic chemotherapy: Secondary | ICD-10-CM

## 2012-05-04 DIAGNOSIS — C569 Malignant neoplasm of unspecified ovary: Secondary | ICD-10-CM

## 2012-06-25 ENCOUNTER — Encounter: Payer: Self-pay | Admitting: Internal Medicine

## 2012-06-28 DIAGNOSIS — C569 Malignant neoplasm of unspecified ovary: Secondary | ICD-10-CM

## 2012-06-28 DIAGNOSIS — Z452 Encounter for adjustment and management of vascular access device: Secondary | ICD-10-CM

## 2012-07-19 ENCOUNTER — Encounter: Payer: Self-pay | Admitting: Internal Medicine

## 2012-07-19 DIAGNOSIS — C569 Malignant neoplasm of unspecified ovary: Secondary | ICD-10-CM

## 2012-07-19 DIAGNOSIS — R911 Solitary pulmonary nodule: Secondary | ICD-10-CM

## 2012-08-30 DIAGNOSIS — Z452 Encounter for adjustment and management of vascular access device: Secondary | ICD-10-CM

## 2012-08-30 DIAGNOSIS — C569 Malignant neoplasm of unspecified ovary: Secondary | ICD-10-CM

## 2012-10-11 DIAGNOSIS — C569 Malignant neoplasm of unspecified ovary: Secondary | ICD-10-CM

## 2012-10-11 DIAGNOSIS — Z452 Encounter for adjustment and management of vascular access device: Secondary | ICD-10-CM

## 2012-11-23 DIAGNOSIS — C569 Malignant neoplasm of unspecified ovary: Secondary | ICD-10-CM

## 2012-11-23 DIAGNOSIS — Z452 Encounter for adjustment and management of vascular access device: Secondary | ICD-10-CM

## 2013-01-03 DIAGNOSIS — C569 Malignant neoplasm of unspecified ovary: Secondary | ICD-10-CM

## 2013-01-03 DIAGNOSIS — Z452 Encounter for adjustment and management of vascular access device: Secondary | ICD-10-CM

## 2013-02-15 DIAGNOSIS — C569 Malignant neoplasm of unspecified ovary: Secondary | ICD-10-CM

## 2013-02-15 DIAGNOSIS — Z452 Encounter for adjustment and management of vascular access device: Secondary | ICD-10-CM

## 2013-03-28 DIAGNOSIS — C569 Malignant neoplasm of unspecified ovary: Secondary | ICD-10-CM

## 2013-03-28 DIAGNOSIS — Z452 Encounter for adjustment and management of vascular access device: Secondary | ICD-10-CM

## 2013-04-17 ENCOUNTER — Encounter: Payer: Self-pay | Admitting: Internal Medicine

## 2013-04-17 DIAGNOSIS — Z8543 Personal history of malignant neoplasm of ovary: Secondary | ICD-10-CM

## 2013-05-09 DIAGNOSIS — C569 Malignant neoplasm of unspecified ovary: Secondary | ICD-10-CM

## 2013-05-09 DIAGNOSIS — Z87891 Personal history of nicotine dependence: Secondary | ICD-10-CM

## 2013-06-20 DIAGNOSIS — Z452 Encounter for adjustment and management of vascular access device: Secondary | ICD-10-CM

## 2013-06-20 DIAGNOSIS — C569 Malignant neoplasm of unspecified ovary: Secondary | ICD-10-CM

## 2013-08-16 DIAGNOSIS — Z452 Encounter for adjustment and management of vascular access device: Secondary | ICD-10-CM

## 2013-08-16 DIAGNOSIS — C569 Malignant neoplasm of unspecified ovary: Secondary | ICD-10-CM

## 2013-10-11 DIAGNOSIS — C569 Malignant neoplasm of unspecified ovary: Secondary | ICD-10-CM

## 2013-10-11 DIAGNOSIS — Z452 Encounter for adjustment and management of vascular access device: Secondary | ICD-10-CM

## 2013-10-30 DIAGNOSIS — C569 Malignant neoplasm of unspecified ovary: Secondary | ICD-10-CM

## 2014-06-11 ENCOUNTER — Other Ambulatory Visit (HOSPITAL_COMMUNITY): Payer: Self-pay | Admitting: Oncology

## 2014-06-11 DIAGNOSIS — C569 Malignant neoplasm of unspecified ovary: Secondary | ICD-10-CM | POA: Insufficient documentation

## 2014-07-23 ENCOUNTER — Other Ambulatory Visit: Payer: Self-pay

## 2014-12-05 ENCOUNTER — Ambulatory Visit (HOSPITAL_COMMUNITY)
Admission: RE | Admit: 2014-12-05 | Discharge: 2014-12-05 | Disposition: A | Discharge status: Home / Self Care | Payer: Self-pay | Source: Ambulatory Visit | Attending: Oncology | Admitting: Oncology

## 2014-12-05 DIAGNOSIS — C569 Malignant neoplasm of unspecified ovary: Secondary | ICD-10-CM

## 2014-12-05 DIAGNOSIS — R918 Other nonspecific abnormal finding of lung field: Secondary | ICD-10-CM | POA: Insufficient documentation

## 2014-12-05 DIAGNOSIS — Z8543 Personal history of malignant neoplasm of ovary: Secondary | ICD-10-CM | POA: Insufficient documentation

## 2014-12-05 DIAGNOSIS — Z87891 Personal history of nicotine dependence: Secondary | ICD-10-CM | POA: Insufficient documentation

## 2014-12-05 DIAGNOSIS — I700 Atherosclerosis of aorta: Secondary | ICD-10-CM | POA: Insufficient documentation

## 2014-12-05 DIAGNOSIS — J439 Emphysema, unspecified: Secondary | ICD-10-CM | POA: Insufficient documentation

## 2015-12-16 DIAGNOSIS — F419 Anxiety disorder, unspecified: Secondary | ICD-10-CM | POA: Insufficient documentation

## 2016-07-14 ENCOUNTER — Encounter (HOSPITAL_COMMUNITY): Payer: 59

## 2016-07-15 ENCOUNTER — Encounter (HOSPITAL_COMMUNITY): Payer: 59 | Attending: Oncology

## 2016-07-15 ENCOUNTER — Encounter (HOSPITAL_COMMUNITY): Payer: Self-pay

## 2016-07-15 VITALS — BP 147/99 | HR 80 | Temp 97.9°F | Resp 20

## 2016-07-15 DIAGNOSIS — C569 Malignant neoplasm of unspecified ovary: Secondary | ICD-10-CM

## 2016-07-15 DIAGNOSIS — Z95828 Presence of other vascular implants and grafts: Secondary | ICD-10-CM

## 2016-07-15 DIAGNOSIS — Z452 Encounter for adjustment and management of vascular access device: Secondary | ICD-10-CM | POA: Diagnosis not present

## 2016-07-15 MED ORDER — SODIUM CHLORIDE 0.9% FLUSH: 10.0000 mL | INTRAVENOUS | Status: DC | PRN

## 2016-07-15 MED ORDER — HEPARIN SOD (PORK) LOCK FLUSH 100 UNIT/ML IV SOLN
500.0000 [IU] | Freq: Once | INTRAVENOUS | Status: AC
  Administered 2016-07-15: 500 [IU] via INTRAVENOUS
  Filled 2016-07-15: qty 5

## 2016-07-15 NOTE — Progress Notes (Signed)
[name] presented for Portacath access and flush. Proper placement of portacath confirmed by CXR. Portacath located lt chest wall accessed with  H 20 needle. Good blood return present. Portacath flushed with 33ml NS and 500U/77ml Heparin and needle removed intact. Procedure without incident. Patient tolerated procedure well.

## 2016-07-15 NOTE — Progress Notes (Signed)
Pt discharged ambulatory and stable.

## 2016-09-08 ENCOUNTER — Encounter (HOSPITAL_COMMUNITY): Payer: 59 | Attending: Oncology

## 2016-09-08 VITALS — BP 132/88 | HR 80 | Temp 98.0°F | Resp 16

## 2016-09-08 DIAGNOSIS — Z452 Encounter for adjustment and management of vascular access device: Secondary | ICD-10-CM

## 2016-09-08 DIAGNOSIS — C569 Malignant neoplasm of unspecified ovary: Secondary | ICD-10-CM | POA: Diagnosis not present

## 2016-09-08 DIAGNOSIS — Z95828 Presence of other vascular implants and grafts: Secondary | ICD-10-CM | POA: Insufficient documentation

## 2016-09-08 MED ORDER — HEPARIN SOD (PORK) LOCK FLUSH 100 UNIT/ML IV SOLN
500.0000 [IU] | Freq: Once | INTRAVENOUS | Status: AC
  Administered 2016-09-08: 500 [IU] via INTRAVENOUS
  Filled 2016-09-08: qty 5

## 2016-09-08 MED ORDER — SODIUM CHLORIDE 0.9% FLUSH
20.0000 mL | INTRAVENOUS | Status: DC | PRN
  Administered 2016-09-08: 20 mL via INTRAVENOUS
  Filled 2016-09-08: qty 20

## 2016-09-08 NOTE — Patient Instructions (Signed)
[facility] at [facility] Discharge Instructions  RECOMMENDATIONS MADE BY THE CONSULTANT AND ANY TEST RESULTS WILL BE SENT TO YOUR REFERRING PHYSICIAN.  Port flush today.    Thank you for choosing [facility] at [facility] to provide your oncology and hematology care.  To afford each patient quality time with our provider, please arrive at least 15 minutes before your scheduled appointment time.   Beginning January 23rd 2017 lab work for the Cancer Center will be done in the  Main lab at [place] on 1st floor. If you have a lab appointment with the Cancer Center please come in thru the  Main Entrance and check in at the main information desk  You need to re-schedule your appointment should you arrive 10 or more minutes late.  We strive to give you quality time with our providers, and arriving late affects you and other patients whose appointments are after yours.  Also, if you no show three or more times for appointments you may be dismissed from the clinic at the providers discretion.     Again, thank you for choosing [facility].  Our hope is that these requests will decrease the amount of time that you wait before being seen by our physicians.       _____________________________________________________________  Should you have questions after your visit to [facility], please contact our office at [phone] between the hours of 8:30 a.m. and 4:30 p.m.  Voicemails left after 4:30 p.m. will not be returned until the following business day.  For prescription refill requests, have your pharmacy contact our office.         Resources For Cancer Patients and their Caregivers ? American Cancer Society: Can assist with transportation, wigs, general needs, runs Look Good Feel Better.        1-888-227-6333 ? Cancer Care: Provides financial assistance, online support groups, medication/co-pay assistance.   1-800-813-HOPE (4673) ? [facility] Assists [place] cancer patients and their families through emotional , educational and financial support.  336-427-4357 ? [place] DSS Where to apply for food stamps, Medicaid and utility assistance. 336-342-1394 ? RCATS: Transportation to medical appointments. 336-347-2287 ? Social Security Administration: May apply for disability if have a Stage IV cancer. 336-342-7796 1-800-772-1213 ? [place] Aging, Disability and Transit Services: Assists with nutrition, care and transit needs. 336-349-2343  Cancer Center Support Programs: @10RELATIVEDAYS@ > Cancer Support Group  2nd Tuesday of the month 1pm-2pm, Journey Room  > Creative Journey  3rd Tuesday of the month 1130am-1pm, Journey Room  > Look Good Feel Better  1st Wednesday of the month 10am-12 noon, Journey Room (Call American Cancer Society to register 1-800-395-5775)    

## 2016-09-08 NOTE — Progress Notes (Signed)
[name] presented for Portacath access and flush. Proper placement of portacath confirmed by CXR. Portacath located left chest wall accessed with  H 20 needle. Good blood return present. Portacath flushed with 50ml NS and 500U/86ml Heparin and needle removed intact. Procedure without incident. Patient tolerated procedure well.

## 2016-11-03 ENCOUNTER — Encounter (HOSPITAL_COMMUNITY): Payer: 59 | Attending: Oncology

## 2016-11-18 ENCOUNTER — Encounter (HOSPITAL_COMMUNITY): Payer: Self-pay

## 2016-11-18 ENCOUNTER — Encounter (HOSPITAL_COMMUNITY): Payer: 59 | Attending: Oncology

## 2016-11-18 VITALS — BP 141/88 | HR 86 | Temp 97.8°F | Resp 16

## 2016-11-18 DIAGNOSIS — Z452 Encounter for adjustment and management of vascular access device: Secondary | ICD-10-CM | POA: Diagnosis not present

## 2016-11-18 DIAGNOSIS — Z95828 Presence of other vascular implants and grafts: Secondary | ICD-10-CM

## 2016-11-18 DIAGNOSIS — C569 Malignant neoplasm of unspecified ovary: Secondary | ICD-10-CM

## 2016-11-18 MED ORDER — SODIUM CHLORIDE 0.9% FLUSH
10.0000 mL | INTRAVENOUS | Status: DC | PRN
  Administered 2016-11-18: 10 mL via INTRAVENOUS
  Filled 2016-11-18: qty 10

## 2016-11-18 MED ORDER — HEPARIN SOD (PORK) LOCK FLUSH 100 UNIT/ML IV SOLN
500.0000 [IU] | Freq: Once | INTRAVENOUS | Status: AC
  Administered 2016-11-18: 500 [IU] via INTRAVENOUS
  Filled 2016-11-18: qty 5

## 2016-11-18 NOTE — Patient Instructions (Signed)
[place] at [facility] Discharge Instructions  RECOMMENDATIONS MADE BY THE CONSULTANT AND ANY TEST RESULTS WILL BE SENT TO YOUR REFERRING PHYSICIAN.  You had your port flushed today. Return every 6 weeks for port flush. Return as scheduled.   Thank you for choosing [place] at [facility] to provide your oncology and hematology care.  To afford each patient quality time with our provider, please arrive at least 15 minutes before your scheduled appointment time.   Beginning January 23rd 2017 lab work for the [facility] will be done in the  Main lab at [facility] on 1st floor. If you have a lab appointment with the [place] please come in thru the  Main Entrance and check in at the main information desk  You need to re-schedule your appointment should you arrive 10 or more minutes late.  We strive to give you quality time with our providers, and arriving late affects you and other patients whose appointments are after yours.  Also, if you no show three or more times for appointments you may be dismissed from the clinic at the providers discretion.     Again, thank you for choosing [facility].  Our hope is that these requests will decrease the amount of time that you wait before being seen by our physicians.       _____________________________________________________________  Should you have questions after your visit to [facility], please contact our office at [phone] between the hours of 8:30 a.m. and 4:30 p.m.  Voicemails left after 4:30 p.m. will not be returned until the following business day.  For prescription refill requests, have your pharmacy contact our office.         Resources For Cancer Patients and their Caregivers ? American Cancer Society: Can assist with transportation, wigs, general needs, runs Look Good Feel Better.        (760)214-1184 ? Cancer Care: Provides financial  assistance, online support groups, medication/co-pay assistance.  1-800-813-HOPE (405) 750-6501) ? [place] Assists [place] cancer patients and their families through emotional , educational and financial support.  501-487-8395 ? [place] DSS Where to apply for food stamps, Medicaid and utility assistance. (623)766-4167 ? RCATS: Transportation to medical appointments. 929-749-7398 ? Social Security Administration: May apply for disability if have a Stage IV cancer. (985) 044-8323 [phone]) 673-0205 ? [employer], Disability and Transit Services: Assists with nutrition, care and transit needs. [place] Support Programs: @10RELATIVEDAYS @ > Cancer Support Group  2nd Tuesday of the month 1pm-2pm, Journey Room  > Creative Journey  3rd Tuesday of the month 1130am-1pm, Journey Room  > Look Good Feel Better  1st Wednesday of the month 10am-12 noon, Journey Room (Call [place] to register [phone])

## 2016-11-18 NOTE — Progress Notes (Signed)
[name] presented for Portacath access and flush.Portacath located left chest wall accessed with  H 20 needle. Sluggish blood return, flushed without resistance.   Portacath flushed with 91ml NS and 500U/7ml Heparin and needle removed intact. Procedure without incident. Patient tolerated procedure well. VS stable, patient discharged ambulatory from clinic, follow up as scheduled.

## 2016-12-26 ENCOUNTER — Encounter (HOSPITAL_COMMUNITY): Payer: Self-pay

## 2016-12-26 ENCOUNTER — Encounter (HOSPITAL_COMMUNITY): Payer: 59 | Attending: Oncology

## 2016-12-26 ENCOUNTER — Encounter (HOSPITAL_COMMUNITY): Payer: 59 | Attending: Hematology & Oncology | Admitting: Oncology

## 2016-12-26 VITALS — BP 135/96 | HR 79 | Temp 97.8°F | Resp 16 | Ht 64.0 in | Wt 163.1 lb

## 2016-12-26 DIAGNOSIS — Z8543 Personal history of malignant neoplasm of ovary: Secondary | ICD-10-CM

## 2016-12-26 DIAGNOSIS — Z95828 Presence of other vascular implants and grafts: Secondary | ICD-10-CM | POA: Insufficient documentation

## 2016-12-26 DIAGNOSIS — F419 Anxiety disorder, unspecified: Secondary | ICD-10-CM | POA: Diagnosis not present

## 2016-12-26 DIAGNOSIS — C561 Malignant neoplasm of right ovary: Secondary | ICD-10-CM

## 2016-12-26 DIAGNOSIS — C569 Malignant neoplasm of unspecified ovary: Secondary | ICD-10-CM | POA: Insufficient documentation

## 2016-12-26 MED ORDER — HEPARIN SOD (PORK) LOCK FLUSH 100 UNIT/ML IV SOLN
500.0000 [IU] | Freq: Once | INTRAVENOUS | Status: AC
  Administered 2016-12-26: 500 [IU] via INTRAVENOUS

## 2016-12-26 MED ORDER — SODIUM CHLORIDE 0.9% FLUSH
10.0000 mL | INTRAVENOUS | Status: DC | PRN
  Administered 2016-12-26: 10 mL via INTRAVENOUS
  Filled 2016-12-26: qty 10

## 2016-12-26 NOTE — Progress Notes (Signed)
[name] presented for Portacath access and flush. Portacath located leftchest wall accessed with  H 20 needle. No blood return and no resistance met. Portacath flushed with 33m NS and 500U/586mHeparin and needle removed intact. Procedure without incident. Patient tolerated procedure well.

## 2016-12-26 NOTE — Patient Instructions (Signed)
[place] at [facility] Discharge Instructions  RECOMMENDATIONS MADE BY THE CONSULTANT AND ANY TEST RESULTS WILL BE SENT TO YOUR REFERRING PHYSICIAN.  Port flush. Labs drawn peripherally.  Thank you for choosing [place] at [facility] to provide your oncology and hematology care.  To afford each patient quality time with our provider, please arrive at least 15 minutes before your scheduled appointment time.    If you have a lab appointment with the [facility] please come in thru the  Main Entrance and check in at the main information desk  You need to re-schedule your appointment should you arrive 10 or more minutes late.  We strive to give you quality time with our providers, and arriving late affects you and other patients whose appointments are after yours.  Also, if you no show three or more times for appointments you may be dismissed from the clinic at the providers discretion.     Again, thank you for choosing [facility].  Our hope is that these requests will decrease the amount of time that you wait before being seen by our physicians.       _____________________________________________________________  Should you have questions after your visit to [facility], please contact our office at [phone] between the hours of 8:30 a.m. and 4:30 p.m.  Voicemails left after 4:30 p.m. will not be returned until the following business day.  For prescription refill requests, have your pharmacy contact our office.       Resources For Cancer Patients and their Caregivers ? American Cancer Society: Can assist with transportation, wigs, general needs, runs Look Good Feel Better.        450-793-8162 ? Cancer Care: Provides financial assistance, online support groups, medication/co-pay assistance.  1-800-813-HOPE 907-219-9301) ? [place] Assists [place] cancer patients and their  families through emotional , educational and financial support.  670-078-7994 ? [place] DSS Where to apply for food stamps, Medicaid and utility assistance. (681) 563-8786 ? RCATS: Transportation to medical appointments. 954-734-5258 ? Social Security Administration: May apply for disability if have a Stage IV cancer. 908-598-1366 (250)347-8080 ? [employer], Disability and Transit Services: Assists with nutrition, care and transit needs. [place] Support Programs: @10RELATIVEDAYS @ > Cancer Support Group  2nd Tuesday of the month 1pm-2pm, Journey Room  > Creative Journey  3rd Tuesday of the month 1130am-1pm, Journey Room  > Look Good Feel Better  1st Wednesday of the month 10am-12 noon, Journey Room (Call [place] to register [phone])

## 2016-12-26 NOTE — Progress Notes (Signed)
[place]  CONSULT NOTE  Patient Care Team: No Pcp Per Patient as PCP - General (General Practice)  CHIEF COMPLAINTS/PURPOSE OF CONSULTATION:  Stage I cancer of the ovary, status post TAH-BSO surgery followed by adjuvant chemotherapy with 6 cycles of carboplatin+taxol. Finished chemotherapy in April 2010.    HISTORY OF PRESENTING ILLNESS:  [name] 46 y.o. female is here because of referral from [facility] hospital for ongoing follow up of ovarian cancer. The patient was last seen by Dr. Jacquiline Doe on 12/16/2015.  Per Dr. Reynaldo Minium oncology history:   The patient initially presented with abdominal bloating and was diagnosed with a right sided ovarian cancer, first diagnosed 10/28/2008. She is status post TAH and BSO, omentectomy with washings. Washings were negative that she had a 21 cm tumor, final stage T1 cN0 M0 disease. She received 6 cycles carboplatinum / Taxol from 12/03/2008 to 03/18/2009. The last was abbreviated to a total dose of 240 mg because of a "reaction" with diffuse flushing/redness all over her body. She had already had a dose reduction after cycle one of her carboplatinum and Taxol. It is not clear from the notes why it was reduced.  Per Dr. Reynaldo Minium assessment and plan on 12/16/2015:  The patient has no evidence of disease. Last CT scan was done in 2015 and was negative.  Last CA 125 in July 2016 was 8.5 - normal.   Most recent CA 125 on 12/16/2015 was 9.2. The patient also underwent bilateral screening mammogram on 12/24/2015 at [facility], results pending.  Patient presents today for establishment for new oncologist. She has been doing well. She works full time in the [employer]. She does not have any health issues and does not take any medications other than Xanax for her anxiety. Overall she feels well. She denies any fatigue, abdominal bloating, chest pain, SOB, weight loss, the rest of ROS is negative.     MEDICAL HISTORY:  Past  Medical History:  Diagnosis Date   Ovarian cancer ([place])     SURGICAL HISTORY: Past Surgical History:  Procedure Laterality Date   ABDOMINAL HYSTERECTOMY     complete   APPENDECTOMY     CHOLECYSTECTOMY      SOCIAL HISTORY: Social History   Social History   Marital status: Legally Separated    Spouse name: N/A   Number of children: N/A   Years of education: N/A   Occupational History   Not on file.   Social History Main Topics   Smoking status: Former Smoker    Packs/day: 2.00    Years: 25.00    Types: Cigarettes    Quit date: 07/15/2012   Smokeless tobacco: Never Used   Alcohol use No   Drug use: No   Sexual activity: Not on file   Other Topics Concern   Not on file   Social History Narrative   No narrative on file    FAMILY HISTORY: History reviewed. No pertinent family history.  ALLERGIES:  is allergic to iohexol; carboplatin; codeine; iodinated diagnostic agents; and penicillins.  MEDICATIONS:  Current Outpatient Prescriptions  Medication Sig Dispense Refill   ALPRAZolam (XANAX) 0.25 MG tablet Take 0.25 mg by mouth daily.     ibuprofen (ADVIL,MOTRIN) 200 MG tablet Take 400 mg by mouth daily as needed.      No current facility-administered medications for this visit.    Facility-Administered Medications Ordered in Other Visits  Medication Dose Route Frequency Provider Last Rate Last Dose   sodium  chloride flush (NS) 0.9 % injection 10 mL  10 mL Intravenous PRN [facility], PA-C   10 mL at 12/26/16 1438    Review of Systems  Constitutional: Negative.  Negative for malaise/fatigue and weight loss.  HENT: Negative.   Eyes: Negative.   Respiratory: Negative.  Negative for shortness of breath.   Cardiovascular: Negative.  Negative for chest pain.  Gastrointestinal: Negative.   Genitourinary: Negative.   Musculoskeletal: Negative.   Skin: Negative.   Neurological: Negative.   Endo/Heme/Allergies: Negative.     Psychiatric/Behavioral: Negative.   All other systems reviewed and are negative.  14 point ROS was done and is otherwise as detailed above or in HPI   PHYSICAL EXAMINATION: ECOG PERFORMANCE STATUS: 0 - Asymptomatic  Vitals:   12/26/16 1437  BP: (!) 135/96  Pulse: 79  Resp: 16  Temp: 97.8 F (36.6 C)   Filed Weights   12/26/16 1437  Weight: 163 lb 1.6 oz (74 kg)     Physical Exam  Constitutional: She is oriented to person, place, and time and well-developed, well-nourished, and in no distress.  HENT:  Head: Normocephalic and atraumatic.  Mouth/Throat: Oropharynx is clear and moist.  Eyes: Conjunctivae and EOM are normal. Pupils are equal, round, and reactive to light.  Neck: Normal range of motion. Neck supple.  Cardiovascular: Normal rate, regular rhythm and normal heart sounds.   Pulmonary/Chest: Effort normal and breath sounds normal.  Abdominal: Soft. Bowel sounds are normal.  Musculoskeletal: Normal range of motion.  Neurological: She is alert and oriented to person, place, and time. Gait normal.  Skin: Skin is warm and dry.  Nursing note and vitals reviewed.   LABORATORY DATA:  I have reviewed the data as listed Lab Results  Component Value Date   WBC 11.4 (H) 10/30/2008   HGB 11.8 (L) 10/30/2008   HCT 36.0 10/30/2008   MCV 92.7 10/30/2008   PLT 673 (H) 10/30/2008   CMP     Component Value Date/Time   NA 133 (L) 10/29/2008 0505   K 4.4 10/29/2008 0505   CL 100 10/29/2008 0505   CO2 26 10/29/2008 0505   GLUCOSE 140 (H) 10/29/2008 0505   BUN 4 (L) 10/29/2008 0505   CREATININE 0.63 10/29/2008 0505   CALCIUM 8.3 (L) 10/29/2008 0505   PROT 6.7 10/21/2008 0850   ALBUMIN 2.7 (L) 10/21/2008 0850   AST 14 10/21/2008 0850   ALT 12 10/21/2008 0850   ALKPHOS 91 10/21/2008 0850   BILITOT 0.5 10/21/2008 0850   GFRNONAA >60 10/29/2008 0505   GFRAA  10/29/2008 0505    >60        The eGFR has been calculated using the MDRD equation. This calculation has  not been validated in all clinical       RADIOGRAPHIC STUDIES: I have personally reviewed the radiological images as listed and agreed with the findings in the report. No results found.  ASSESSMENT & PLAN:  46 year old female with pT1cpN0pMx (FIGO stage Ic) right ovarian adenocarcinoma s/p TAH-BSO followed by 6 cycles of adjuvant carboplatin/taxol completed in April 2010.  PLAN: Clinically NED. Patient is now 8 years out from her original diagnosis. She is likely cured.   CA 125 results pending today.   We will send her to interventional radiology to have her port removed since she now has insurance.  Will return to clinic for follow up in one year with CA 125. We will order CT scans only as clinically indicated based symptoms.  ORDERS PLACED FOR THIS ENCOUNTER: Orders Placed This Encounter  Procedures   CA 125    MEDICATIONS PRESCRIBED THIS ENCOUNTER: No orders of the defined types were placed in this encounter.    All questions were answered. The patient knows to call the clinic with any problems, questions or concerns.  I spent 30 minutes counseling the patient face to face. The total time spent in the appointment was 30 minutes and more than 50% was on counseling and review of test results  This document serves as a record of services personally performed by Twana First, MD. It was created on her behalf by Arlyce Harman, a trained medical scribe. The creation of this record is based on the scribe's personal observations and the provider's statements to them. This document has been checked and approved by the attending provider.  I have reviewed the above documentation for accuracy and completeness and I agree with the above.  Twana First, MD  This note was electronically signed.    Carlis Abbott  12/26/2016 3:08 PM

## 2016-12-26 NOTE — Patient Instructions (Signed)
[place] at [facility] Discharge Instructions  RECOMMENDATIONS MADE BY THE CONSULTANT AND ANY TEST RESULTS WILL BE SENT TO YOUR REFERRING PHYSICIAN.  You were seen today by Dr. Talbert Cage Interventional Radiology will schedule you to remove your port-a-cath Follow up in 1 year with lab work.  Thank you for choosing [place] at [facility] to provide your oncology and hematology care.  To afford each patient quality time with our provider, please arrive at least 15 minutes before your scheduled appointment time.    If you have a lab appointment with the [facility] please come in thru the  Main Entrance and check in at the main information desk  You need to re-schedule your appointment should you arrive 10 or more minutes late.  We strive to give you quality time with our providers, and arriving late affects you and other patients whose appointments are after yours.  Also, if you no show three or more times for appointments you may be dismissed from the clinic at the providers discretion.     Again, thank you for choosing [facility].  Our hope is that these requests will decrease the amount of time that you wait before being seen by our physicians.       _____________________________________________________________  Should you have questions after your visit to [facility], please contact our office at [phone] between the hours of 8:30 a.m. and 4:30 p.m.  Voicemails left after 4:30 p.m. will not be returned until the following business day.  For prescription refill requests, have your pharmacy contact our office.       Resources For Cancer Patients and their Caregivers ? American Cancer Society: Can assist with transportation, wigs, general needs, runs Look Good Feel Better.        [phone] ? Cancer Care: Provides financial assistance, online support groups, medication/co-pay assistance.   1-800-813-HOPE 321-858-4808) ? [place] Assists [place] cancer patients and their families through emotional , educational and financial support.  (213)123-7604 ? [place] DSS Where to apply for food stamps, Medicaid and utility assistance. 4848740633 ? RCATS: Transportation to medical appointments. ([phone])872-5014 ? Social Security Administration: May apply for disability if have a Stage IV cancer. [phone])854-2282 [phone] ? [employer], Disability and Transit Services: Assists with nutrition, care and transit needs. [place] Support Programs: @10RELATIVEDAYS @ > Cancer Support Group  2nd Tuesday of the month 1pm-2pm, Journey Room  > Creative Journey  3rd Tuesday of the month 1130am-1pm, Journey Room  > Look Good Feel Better  1st Wednesday of the month 10am-12 noon, Journey Room (Call [place] to register [phone])

## 2016-12-27 ENCOUNTER — Encounter (HOSPITAL_COMMUNITY): Payer: Self-pay | Admitting: Lab

## 2016-12-27 LAB — CA 125: CA 125: 7.7 U/mL (ref 0.0–38.1)

## 2016-12-27 NOTE — Progress Notes (Unsigned)
Referral sent to Dr Arnoldo Morale for port removal.  Records faxed on 1/30

## 2017-02-02 ENCOUNTER — Encounter: Payer: Self-pay | Admitting: General Surgery

## 2017-02-02 ENCOUNTER — Ambulatory Visit (INDEPENDENT_AMBULATORY_CARE_PROVIDER_SITE_OTHER): Payer: 59 | Admitting: General Surgery

## 2017-02-02 VITALS — BP 156/95 | HR 93 | Temp 97.3°F | Resp 20 | Ht 64.0 in | Wt 160.0 lb

## 2017-02-02 DIAGNOSIS — C569 Malignant neoplasm of unspecified ovary: Secondary | ICD-10-CM | POA: Diagnosis not present

## 2017-02-02 NOTE — Progress Notes (Signed)
[name]; [id number]; Oct 08, 1971   HPI  patient is a 46 year old white female who was referred by oncology for Port-A-Cath removal.  She has been history of treatment for ovarian cancer. Past Medical History:  Diagnosis Date  . Ovarian cancer [facility])     Past Surgical History:  Procedure Laterality Date  . ABDOMINAL HYSTERECTOMY     complete  . APPENDECTOMY    . CHOLECYSTECTOMY      History reviewed. No pertinent family history.  Current Outpatient Prescriptions on File Prior to Visit  Medication Sig Dispense Refill  . ibuprofen (ADVIL,MOTRIN) 200 MG tablet Take 800 mg by mouth 2 (two) times daily as needed for headache or moderate pain.      No current facility-administered medications on file prior to visit.     Allergies  Allergen Reactions  . Iohexol Hives and Shortness Of Breath        . Carboplatin Nausea And Vomiting  . Codeine Nausea And Vomiting  . Iodinated Diagnostic Agents Rash    Ct dye  . Penicillins Hives    Has patient had a PCN reaction causing immediate rash, facial/tongue/throat swelling, SOB or lightheadedness with hypotension: No Has patient had a PCN reaction causing severe rash involving mucus membranes or skin necrosis: Yes Has patient had a PCN reaction that required hospitalization Yes Has patient had a PCN reaction occurring within the last 10 years: No If all of the above answers are "NO", then may proceed with Cephalosporin use.     History  Alcohol Use No    History  Smoking Status  . Former Smoker  . Packs/day: 2.00  . Years: 25.00  . Types: Cigarettes  . Quit date: 07/15/2012  Smokeless Tobacco  . Never Used    Review of Systems  Constitutional: Negative.   HENT: Negative.   Eyes: Negative.   Respiratory: Negative.   Cardiovascular: Negative.   Gastrointestinal: Negative.   Genitourinary: Negative.   Musculoskeletal: Negative.   Skin: Negative.   All other systems reviewed and are negative.   Objective    Vitals:   02/02/17 1124  BP: (!) 156/95  Pulse: 93  Resp: 20  Temp: 97.3 F (36.3 C)    Physical Exam  Constitutional: She is oriented to person, place, and time and well-developed, well-nourished, and in no distress.  HENT:  Head: Normocephalic and atraumatic.  Neck: Normal range of motion. Neck supple.  Cardiovascular: Normal rate, regular rhythm and normal heart sounds.   Port-A-Cath in place, left upper chest  Pulmonary/Chest: Effort normal and breath sounds normal. She has no wheezes. She has no rales.  Neurological: She is alert and oriented to person, place, and time.  Skin: Skin is warm and dry.  Vitals reviewed.   Assessment    History of ovarian carcinoma, finished with chemotherapy Plan    patient is scheduled for Port-A-Cath removal in the minor procedure room on 02/06/2017.  Risks and benefits of the procedure were fully explained to the patient, who gave informed consent.

## 2017-02-02 NOTE — Patient Instructions (Signed)
Implanted Port Removal Implanted port removal is a procedure to remove the port and catheter (port-a-cath) that is implanted under your skin. The port is a small disc under your skin that can be punctured with a needle. It is connected to a vein in your chest or neck by a small flexible tube (catheter). The port-a-cath is used for treatment through an IV tube and for taking blood samples. Your health care provider will remove the port-a-cath if:  You no longer need it for treatment.  It is not working properly.  The area around it gets infected. Tell a health care provider about:  Any allergies you have.  All medicines you are taking, including vitamins, herbs, eye drops, creams, and over-the-counter medicines.  Any problems you or family members have had with anesthetic medicines.  Any blood disorders you have.  Any surgeries you have had.  Any medical conditions you have.  Whether you are pregnant or may be pregnant. What are the risks? Generally, this is a safe procedure. However, problems may occur, including:  Infection.  Bleeding.  Allergic reactions to anesthetic medicines.  Damage to nerves or blood vessels. What happens before the procedure?  You will have:  A physical exam.  Blood tests.  Imaging tests, including a chest X-ray.  Follow instructions from your health care provider about eating or drinking restrictions.  Ask your health care provider about:  Changing or stopping your regular medicines. This is especially important if you are taking diabetes medicines or blood thinners.  Taking medicines such as aspirin and ibuprofen. These medicines can thin your blood. Do not take these medicines before your procedure if your surgeon instructs you not to.  Ask your health care provider how your surgical site will be marked or identified.  You may be given antibiotic medicine to help prevent infection.  Plan to have someone take you home after the  procedure.  If you will be going home right after the procedure, plan to have someone stay with you for 24 hours. What happens during the procedure?  To reduce your risk of infection:  Your health care team will wash or sanitize their hands.  Your skin will be washed with soap.  You may be given one or more of the following:  A medicine to help you relax (sedative).  A medicine to numb the area (local anesthetic).  A small cut (incision) will be made at the site of your port-a-cath.  The port-a-cath and the catheter that has been inside your vein will gently be removed.  The incision will be closed with stitches (sutures), adhesive strips, or skin glue.  A bandage (dressing) will be placed over the incision. The procedure may vary among health care providers and hospitals. What happens after the procedure?  Your blood pressure, heart rate, breathing rate, and blood oxygen level will be monitored often until the medicines you were given have worn off.  Do not drive for 24 hours if you received a sedative. This information is not intended to replace advice given to you by your health care provider. Make sure you discuss any questions you have with your health care provider. Document Released: 10/26/2015 Document Revised: 04/21/2016 Document Reviewed: 08/19/2015 Elsevier Interactive Patient Education  2017 Elsevier Inc.  

## 2017-02-02 NOTE — H&P (Signed)
[name]; [id number]; [birth year]   HPI  patient is a 46 year old white female who was referred by oncology for Port-A-Cath removal.  She has been history of treatment for ovarian cancer.     Past Medical History:  Diagnosis Date  . Ovarian cancer [facility])          Past Surgical History:  Procedure Laterality Date  . ABDOMINAL HYSTERECTOMY     complete  . APPENDECTOMY    . CHOLECYSTECTOMY      History reviewed. No pertinent family history.        Current Outpatient Prescriptions on File Prior to Visit  Medication Sig Dispense Refill  . ibuprofen (ADVIL,MOTRIN) 200 MG tablet Take 800 mg by mouth 2 (two) times daily as needed for headache or moderate pain.      No current facility-administered medications on file prior to visit.          Allergies  Allergen Reactions  . Iohexol Hives and Shortness Of Breath        . Carboplatin Nausea And Vomiting  . Codeine Nausea And Vomiting  . Iodinated Diagnostic Agents Rash    Ct dye  . Penicillins Hives    Has patient had a PCN reaction causing immediate rash, facial/tongue/throat swelling, SOB or lightheadedness with hypotension: No Has patient had a PCN reaction causing severe rash involving mucus membranes or skin necrosis: Yes Has patient had a PCN reaction that required hospitalization Yes Has patient had a PCN reaction occurring within the last 10 years: No If all of the above answers are "NO", then may proceed with Cephalosporin use.        History  Alcohol Use No        History  Smoking Status  . Former Smoker  . Packs/day: 2.00  . Years: 25.00  . Types: Cigarettes  . Quit date: 07/15/2012  Smokeless Tobacco  . Never Used    Review of Systems  Constitutional: Negative.   HENT: Negative.   Eyes: Negative.   Respiratory: Negative.   Cardiovascular: Negative.   Gastrointestinal: Negative.   Genitourinary: Negative.   Musculoskeletal: Negative.   Skin: Negative.    All other systems reviewed and are negative.   Objective      Vitals:   02/02/17 1124  BP: (!) 156/95  Pulse: 93  Resp: 20  Temp: 97.3 F (36.3 C)    Physical Exam  Constitutional: She is oriented to person, place, and time and well-developed, well-nourished, and in no distress.  HENT:  Head: Normocephalic and atraumatic.  Neck: Normal range of motion. Neck supple.  Cardiovascular: Normal rate, regular rhythm and normal heart sounds.   Port-A-Cath in place, left upper chest  Pulmonary/Chest: Effort normal and breath sounds normal. She has no wheezes. She has no rales.  Neurological: She is alert and oriented to person, place, and time.  Skin: Skin is warm and dry.  Vitals reviewed.   Assessment    History of ovarian carcinoma, finished with chemotherapy Plan    patient is scheduled for Port-A-Cath removal in the minor procedure room on 02/06/2017.  Risks and benefits of the procedure were fully explained to the patient, who gave informed consent.

## 2017-02-06 ENCOUNTER — Ambulatory Visit (HOSPITAL_COMMUNITY)
Admission: RE | Admit: 2017-02-06 | Discharge: 2017-02-06 | Disposition: A | Discharge status: Home / Self Care | Payer: 59 | Source: Ambulatory Visit | Attending: General Surgery | Admitting: General Surgery

## 2017-02-06 ENCOUNTER — Encounter (HOSPITAL_COMMUNITY)
Admission: RE | Disposition: A | Discharge status: Home / Self Care | Payer: Self-pay | Source: Ambulatory Visit | Attending: General Surgery

## 2017-02-06 ENCOUNTER — Encounter (HOSPITAL_COMMUNITY): Payer: Self-pay | Admitting: *Deleted

## 2017-02-06 DIAGNOSIS — Z452 Encounter for adjustment and management of vascular access device: Secondary | ICD-10-CM | POA: Diagnosis present

## 2017-02-06 DIAGNOSIS — C569 Malignant neoplasm of unspecified ovary: Secondary | ICD-10-CM | POA: Diagnosis not present

## 2017-02-06 DIAGNOSIS — Z880 Allergy status to penicillin: Secondary | ICD-10-CM | POA: Insufficient documentation

## 2017-02-06 DIAGNOSIS — Z8543 Personal history of malignant neoplasm of ovary: Secondary | ICD-10-CM | POA: Insufficient documentation

## 2017-02-06 DIAGNOSIS — Z9221 Personal history of antineoplastic chemotherapy: Secondary | ICD-10-CM | POA: Diagnosis not present

## 2017-02-06 DIAGNOSIS — Z87891 Personal history of nicotine dependence: Secondary | ICD-10-CM | POA: Diagnosis not present

## 2017-02-06 DIAGNOSIS — Z791 Long term (current) use of non-steroidal anti-inflammatories (NSAID): Secondary | ICD-10-CM | POA: Diagnosis not present

## 2017-02-06 DIAGNOSIS — Z91041 Radiographic dye allergy status: Secondary | ICD-10-CM | POA: Diagnosis not present

## 2017-02-06 HISTORY — PX: PORT-A-CATH REMOVAL: SHX5289

## 2017-02-06 SURGERY — MINOR REMOVAL PORT-A-CATH
Anesthesia: LOCAL

## 2017-02-06 MED ORDER — LIDOCAINE HCL (PF) 1 % IJ SOLN
INTRAMUSCULAR | Status: AC
Start: 2017-02-06 — End: 2017-02-06
  Filled 2017-02-06: qty 30

## 2017-02-06 MED ORDER — HYDROCODONE-ACETAMINOPHEN 5-325 MG PO TABS: 1.0000 | ORAL_TABLET | Freq: Four times a day (QID) | ORAL | 0 refills | Status: DC | PRN

## 2017-02-06 MED ORDER — LIDOCAINE HCL (PF) 1 % IJ SOLN
INTRAMUSCULAR | Status: DC | PRN
  Administered 2017-02-06: 12 mL

## 2017-02-06 SURGICAL SUPPLY — 20 items
CLOTH BEACON ORANGE TIMEOUT ST (SAFETY) ×2 IMPLANT
DECANTER SPIKE VIAL GLASS SM (MISCELLANEOUS) ×2 IMPLANT
DRAPE PROXIMA HALF (DRAPES) ×2 IMPLANT
DURAPREP 6ML APPLICATOR 50/CS (WOUND CARE) ×2 IMPLANT
ELECT REM PT RETURN 9FT ADLT (ELECTROSURGICAL) ×2
ELECTRODE REM PT RTRN 9FT ADLT (ELECTROSURGICAL) ×1 IMPLANT
GLOVE BIOGEL PI IND STRL 7.0 (GLOVE) ×1 IMPLANT
GLOVE BIOGEL PI INDICATOR 7.0 (GLOVE) ×1
GLOVE SURG SS PI 7.5 STRL IVOR (GLOVE) ×2 IMPLANT
GOWN STRL REUS W/TWL LRG LVL3 (GOWN DISPOSABLE) ×2 IMPLANT
LIQUID BAND (GAUZE/BANDAGES/DRESSINGS) ×2 IMPLANT
NDL HYPO 25X1 1.5 SAFETY (NEEDLE) ×1 IMPLANT
NEEDLE HYPO 25X1 1.5 SAFETY (NEEDLE) ×2 IMPLANT
PENCIL HANDSWITCHING (ELECTRODE) ×2 IMPLANT
SPONGE GAUZE 2X2 8PLY STRL LF (GAUZE/BANDAGES/DRESSINGS) ×2 IMPLANT
SUT VIC AB 3-0 SH 27 (SUTURE) ×2
SUT VIC AB 3-0 SH 27X BRD (SUTURE) ×1 IMPLANT
SUT VIC AB 4-0 PS2 27 (SUTURE) ×2 IMPLANT
SYR CONTROL 10ML LL (SYRINGE) ×2 IMPLANT
TOWEL OR 17X26 4PK STRL BLUE (TOWEL DISPOSABLE) ×2 IMPLANT

## 2017-02-06 NOTE — Discharge Instructions (Signed)
Implanted Port Removal, Care After °Refer to this sheet in the next few weeks. These instructions provide you with information about caring for yourself after your procedure. Your health care provider may also give you more specific instructions. Your treatment has been planned according to current medical practices, but problems sometimes occur. Call your health care provider if you have any problems or questions after your procedure. °What can I expect after the procedure? °After the procedure, it is common to have: °· Soreness or pain near your incision. °· Some swelling or bruising near your incision. °Follow these instructions at home: °Medicines  °· Take over-the-counter and prescription medicines only as told by your health care provider. °· If you were prescribed an antibiotic medicine, take it as told by your health care provider. Do not stop taking the antibiotic even if you start to feel better. °Bathing  °· Do not take baths, swim, or use a hot tub until your health care provider approves. Ask your health care provider if you can take showers. You may only be allowed to take sponge baths for bathing. °Incision care  °· Follow instructions from your health care provider about how to take care of your incision. Make sure you: °¨ Wash your hands with soap and water before you change your bandage (dressing). If soap and water are not available, use hand sanitizer. °¨ Change your dressing as told by your health care provider. °¨ Keep your dressing dry. °¨ Leave stitches (sutures), skin glue, or adhesive strips in place. These skin closures may need to stay in place for 2 weeks or longer. If adhesive strip edges start to loosen and curl up, you may trim the loose edges. Do not remove adhesive strips completely unless your health care provider tells you to do that. °· Check your incision area every day for signs of infection. Check for: °¨ More redness, swelling, or pain. °¨ More fluid or  blood. °¨ Warmth. °¨ Pus or a bad smell. °Driving  °· If you received a sedative, do not drive for 24 hours after the procedure. °· If you did not receive a sedative, ask your health care provider when it is safe to drive. °Activity  °· Return to your normal activities as told by your health care provider. Ask your health care provider what activities are safe for you. °· Until your health care provider says it is safe: °¨ Do not lift anything that is heavier than 10 lb (4.5 kg). °¨ Do not do activities that involve lifting your arms over your head. °General instructions  °· Do not use any tobacco products, such as cigarettes, chewing tobacco, and e-cigarettes. Tobacco can delay healing. If you need help quitting, ask your health care provider. °· Keep all follow-up visits as told by your health care provider. This is important. °Contact a health care provider if: °· You have more redness, swelling, or pain around your incision. °· You have more fluid or blood coming from your incision. °· Your incision feels warm to the touch. °· You have pus or a bad smell coming from your incision. °· You have a fever. °· You have pain that is not relieved by your pain medicine. °Get help right away if: °· You have chest pain. °· You have difficulty breathing. °This information is not intended to replace advice given to you by your health care provider. Make sure you discuss any questions you have with your health care provider. °Document Released: 10/26/2015 Document Revised: 04/21/2016 Document   Reviewed: 08/19/2015 °Elsevier Interactive Patient Education © 2017 Elsevier Inc. ° °

## 2017-02-06 NOTE — Op Note (Signed)
Patient:  TYQUASIA PANT  DOB:  1971-09-15  MRN:  [id number]   Preop Diagnosis:  History of ovarian carcinoma, finished with chemotherapy  Postop Diagnosis:  Same  Procedure:  Port-A-Cath removal  Surgeon:  Aviva Signs, M.D.  Anes:  Local  Indications:  Patient is a 46 year old white female who presents for Port-A-Cath removal. She has finished with chemotherapy. The risks and benefits of the procedure including bleeding and infection were fully explained to the patient, who gave informed consent.  Procedure note:  The patient was placed in supine position. The left upper chest was prepped and draped using usual sterile technique with DuraPrep. Surgical site confirmation was performed. One percent Xylocaine was used for local anesthesia.  An incision was made in the left upper chest through the previous surgical incision site. Dissection was taken down to the port. The Port-A-Cath was removed in total without difficulty. The was disposed of. The catheter tract was oversewn using a 3-0 Vicryl figure-of-eight suture. The subcutaneous layer was reapproximated using a 3-0 Vicryl interrupted suture. The skin was closed using a 4-0 Vicryl subcuticular suture. Dermabond was applied.  All tape and needle counts were correct at the end of the procedure. The patient was discharged from the minor procedure room in good and stable condition.  Complications:  None  EBL:  Minimal  Specimen:  None

## 2017-02-06 NOTE — Interval H&P Note (Signed)
History and Physical Interval Note:  02/06/2017 8:19 AM  [name]  has presented today for surgery, with the diagnosis of ovarian cancer  The various methods of treatment have been discussed with the patient and family. After consideration of risks, benefits and other options for treatment, the patient has consented to  Procedure(s) with comments: MINOR REMOVAL PORT-A-CATH (N/A) - To be done in Minor Room as a surgical intervention .  The patient's history has been reviewed, patient examined, no change in status, stable for surgery.  I have reviewed the patient's chart and labs.  Questions were answered to the patient's satisfaction.     Aviva Signs

## 2017-02-09 ENCOUNTER — Encounter (HOSPITAL_COMMUNITY): Payer: Self-pay | Admitting: General Surgery

## 2017-12-28 ENCOUNTER — Inpatient Hospital Stay (HOSPITAL_COMMUNITY): Payer: 59 | Attending: Oncology | Admitting: Oncology

## 2017-12-28 ENCOUNTER — Other Ambulatory Visit: Payer: Self-pay

## 2017-12-28 ENCOUNTER — Encounter (HOSPITAL_COMMUNITY): Payer: Self-pay | Admitting: Oncology

## 2017-12-28 VITALS — HR 83 | Temp 98.2°F | Resp 18 | Ht 64.0 in | Wt 166.4 lb

## 2017-12-28 DIAGNOSIS — Z90710 Acquired absence of both cervix and uterus: Secondary | ICD-10-CM

## 2017-12-28 DIAGNOSIS — C561 Malignant neoplasm of right ovary: Secondary | ICD-10-CM

## 2017-12-28 DIAGNOSIS — R1900 Intra-abdominal and pelvic swelling, mass and lump, unspecified site: Secondary | ICD-10-CM | POA: Insufficient documentation

## 2017-12-28 DIAGNOSIS — Z87891 Personal history of nicotine dependence: Secondary | ICD-10-CM

## 2017-12-28 DIAGNOSIS — R140 Abdominal distension (gaseous): Secondary | ICD-10-CM | POA: Diagnosis not present

## 2017-12-28 DIAGNOSIS — Z90722 Acquired absence of ovaries, bilateral: Secondary | ICD-10-CM | POA: Diagnosis not present

## 2017-12-28 DIAGNOSIS — Z9221 Personal history of antineoplastic chemotherapy: Secondary | ICD-10-CM | POA: Diagnosis not present

## 2017-12-28 DIAGNOSIS — Z8543 Personal history of malignant neoplasm of ovary: Secondary | ICD-10-CM | POA: Insufficient documentation

## 2017-12-28 NOTE — Progress Notes (Signed)
[facility]  CONSULT NOTE  Patient Care Team: Rosine Door as PCP - General (Physician Assistant)  CHIEF COMPLAINTS/PURPOSE OF CONSULTATION:  Stage I cancer of the ovary, status post TAH-BSO surgery followed by adjuvant chemotherapy with 6 cycles of carboplatin+taxol. Finished chemotherapy in April 2010.    HISTORY OF PRESENTING ILLNESS:  [name] 47 y.o. female is here because of referral from [facility] hospital for ongoing follow up of ovarian cancer. The patient was last seen by Dr. Jacquiline Doe on 12/16/2015.  Per Dr. Reynaldo Minium oncology history:   The patient initially presented with abdominal bloating and was diagnosed with a right sided ovarian cancer, first diagnosed 10/28/2008. She is status post TAH and BSO, omentectomy with washings. Washings were negative that she had a 21 cm tumor, final stage T1 cN0 M0 disease. She received 6 cycles carboplatinum / Taxol from 12/03/2008 to 03/18/2009. The last was abbreviated to a total dose of 240 mg because of a "reaction" with diffuse flushing/redness all over her body. She had already had a dose reduction after cycle one of her carboplatinum and Taxol. It is not clear from the notes why it was reduced.  Per Dr. Reynaldo Minium assessment and plan on 12/16/2015:  The patient has no evidence of disease. Last CT scan was done in 2015 and was negative.  Last CA 125 in July 2016 was 8.5 - normal.   Most recent CA 125 on 12/16/2015 was 9.2. The patient also underwent bilateral screening mammogram on 12/24/2015 at [facility], results pending.  Patient presents today for continued follow-up.  She states she has been doing well.  Her appetite is 100% and energy level is 100%.  Patient states beginning November 28, 2017 she began to try to lose weight and eat better but developed a severe abscessed tooth that required an antibiotic, developed 1-1/2-week of dark tarry stools and a small "mass" near her rectum.  She is scheduled  to see Dr. Arnoldo Morale next week with GI for a colonoscopy.  She has never had a colonoscopy before she admits to occasional hemorrhoids that are painful and bleed.  She also admits to occasional nausea that is relieved with Zofran.  She overall feels well and denies any chest pain, shortness of breath, weight loss or fatigue.   MEDICAL HISTORY:  Past Medical History:  Diagnosis Date  . Ovarian cancer [facility])     SURGICAL HISTORY: Past Surgical History:  Procedure Laterality Date  . ABDOMINAL HYSTERECTOMY     complete  . APPENDECTOMY    . CHOLECYSTECTOMY    . PORT-A-CATH REMOVAL N/A 02/06/2017   Procedure: MINOR REMOVAL PORT-A-CATH;  Surgeon: Aviva Signs, MD;  Location: [facility];  Service: General;  Laterality: N/A;  To be done in Minor Room    SOCIAL HISTORY: Social History   Socioeconomic History  . Marital status: Legally Separated    Spouse name: Not on file  . Number of children: Not on file  . Years of education: Not on file  . Highest education level: Not on file  Social Needs  . Financial resource strain: Not on file  . Food insecurity - worry: Not on file  . Food insecurity - inability: Not on file  . Transportation needs - medical: Not on file  . Transportation needs - non-medical: Not on file  Occupational History  . Not on file  Tobacco Use  . Smoking status: Former Smoker    Packs/day: 2.00    Years: 25.00  Pack years: 50.00    Types: Cigarettes    Last attempt to quit: 07/15/2012    Years since quitting: 5.4  . Smokeless tobacco: Never Used  Substance and Sexual Activity  . Alcohol use: No  . Drug use: No  . Sexual activity: Not on file  Other Topics Concern  . Not on file  Social History Narrative  . Not on file    FAMILY HISTORY: History reviewed. No pertinent family history.  ALLERGIES:  is allergic to iohexol; carboplatin; codeine; iodinated diagnostic agents; and penicillins.  MEDICATIONS:  Current Outpatient Medications  Medication Sig  Dispense Refill  . acetaminophen (TYLENOL) 500 MG tablet Take 1,000 mg by mouth 2 (two) times daily as needed for moderate pain or headache.    . ALPRAZolam (XANAX) 0.5 MG tablet Take 0.5 mg by mouth daily as needed for anxiety.    Marland Kitchen escitalopram (LEXAPRO) 10 MG tablet Take 10 mg by mouth daily.  0  . HYDROcodone-acetaminophen (NORCO) 5-325 MG tablet Take 1-2 tablets by mouth every 6 (six) hours as needed for moderate pain. 30 tablet 0  . ibuprofen (ADVIL,MOTRIN) 200 MG tablet Take 800 mg by mouth 2 (two) times daily as needed for headache or moderate pain.      No current facility-administered medications for this visit.     Review of Systems  Constitutional: Negative.  Negative for malaise/fatigue and weight loss.  HENT: Negative.        Abscessed tooth treated with antibiotic by PCP  Eyes: Negative.   Respiratory: Negative.  Negative for shortness of breath.   Cardiovascular: Negative.  Negative for chest pain.  Gastrointestinal: Positive for blood in stool (For 1 week) and nausea (Occasional).       Hemorrhoids Small rectal mass to be evaluated by Dr. Arnoldo Morale next week  Genitourinary: Negative.   Musculoskeletal: Negative.   Skin: Negative.   Neurological: Negative.  Negative for dizziness and headaches.  Endo/Heme/Allergies: Negative.   Psychiatric/Behavioral: Negative.   All other systems reviewed and are negative.  14 point ROS was done and is otherwise as detailed above or in HPI   PHYSICAL EXAMINATION: ECOG PERFORMANCE STATUS: 0 - Asymptomatic  Vitals:   12/28/17 1025  Pulse: 83  Resp: 18  Temp: 98.2 F (36.8 C)  SpO2: 99%   Filed Weights   12/28/17 1025  Weight: 166 lb 6.4 oz (75.5 kg)     Physical Exam  Constitutional: She is oriented to person, place, and time and well-developed, well-nourished, and in no distress.  HENT:  Head: Normocephalic and atraumatic.  Mouth/Throat: Oropharynx is clear and moist.  Eyes: Conjunctivae and EOM are normal. Pupils  are equal, round, and reactive to light.  Neck: Normal range of motion. Neck supple.  Cardiovascular: Normal rate, regular rhythm and normal heart sounds.  Pulmonary/Chest: Effort normal and breath sounds normal.  Abdominal: Soft. Bowel sounds are normal.  Musculoskeletal: Normal range of motion.  Neurological: She is alert and oriented to person, place, and time. Gait normal.  Skin: Skin is warm and dry.  Nursing note and vitals reviewed.   LABORATORY DATA:  I have reviewed the data as listed Lab Results  Component Value Date   WBC 11.4 (H) 10/30/2008   HGB 11.8 (L) 10/30/2008   HCT 36.0 10/30/2008   MCV 92.7 10/30/2008   PLT 673 (H) 10/30/2008   CMP     Component Value Date/Time   NA 133 (L) 10/29/2008 0505   K 4.4 10/29/2008 0505  CL 100 10/29/2008 0505   CO2 26 10/29/2008 0505   GLUCOSE 140 (H) 10/29/2008 0505   BUN 4 (L) 10/29/2008 0505   CREATININE 0.63 10/29/2008 0505   CALCIUM 8.3 (L) 10/29/2008 0505   PROT 6.7 10/21/2008 0850   ALBUMIN 2.7 (L) 10/21/2008 0850   AST 14 10/21/2008 0850   ALT 12 10/21/2008 0850   ALKPHOS 91 10/21/2008 0850   BILITOT 0.5 10/21/2008 0850   GFRNONAA >60 10/29/2008 0505   GFRAA  10/29/2008 0505    >60        The eGFR has been calculated using the MDRD equation. This calculation has not been validated in all clinical       RADIOGRAPHIC STUDIES: I have personally reviewed the radiological images as listed and agreed with the findings in the report. No results found.  ASSESSMENT & PLAN:  47 year old female with [id number] (FIGO stage Ic) right ovarian adenocarcinoma s/p TAH-BSO followed by 6 cycles of adjuvant carboplatin/taxol completed in April 2010.  PLAN:  Clinically no evidence of disease.  Patient is now 8 years out from her original diagnosis.  Ca 125 is 7.7 today.  Patient to follow-up with Surgery, Dr. Arnoldo Morale next week with colonoscopy for rectal mass and bloody stools. Will follow up with patient after visit  with Dr. Arnoldo Morale.   Will return to clinic for follow up in one year with CA 125. We will order CT scans only as clinically indicated based symptoms.    ORDERS PLACED FOR THIS ENCOUNTER: No orders of the defined types were placed in this encounter.   MEDICATIONS PRESCRIBED THIS ENCOUNTER: No orders of the defined types were placed in this encounter.   Greater than 50% was spent in counseling and coordination of care with this patient including but not limited to discussion of the relevant topics above (See A&P) including, but not limited to diagnosis and management of acute and chronic medical conditions.  This note was electronically signed.    Jacquelin Hawking, NP  12/28/2017 4:16 PM

## 2018-01-09 ENCOUNTER — Encounter: Payer: Self-pay | Admitting: General Surgery

## 2018-01-09 ENCOUNTER — Ambulatory Visit: Payer: 59 | Admitting: General Surgery

## 2018-01-09 VITALS — BP 157/100 | HR 83 | Temp 98.0°F | Ht 64.0 in | Wt 167.0 lb

## 2018-01-09 DIAGNOSIS — Z800 Family history of malignant neoplasm of digestive organs: Secondary | ICD-10-CM | POA: Diagnosis not present

## 2018-01-09 DIAGNOSIS — R1310 Dysphagia, unspecified: Secondary | ICD-10-CM

## 2018-01-09 MED ORDER — PEG 3350-KCL-NABCB-NACL-NASULF 236 G PO SOLR
4000.0000 mL | Freq: Once | ORAL | 0 refills | Status: AC
Start: 2018-01-09 — End: 2018-01-09

## 2018-01-09 NOTE — H&P (Signed)
[name]; [id number]; [birth year]  HPI  Patient is a 47 year old white female with a history of ovarian carcinoma who was referred to my care by Dr. Quillian Quince for evaluation and treatment of a change in bowel habits as well as dysphasia. Patient states that her bowels have become darker and are decreased in caliber over the past few months. She does have a family history of colon carcinoma. She denies any diarrhea. She thinks she may have some blood in her stools on occasion. She currently has no abdominal pain. She also has had some swallowing difficulties for some time now. She does have heartburn. Certain solids get stuck in the midportion of her esophagus. She denies any nausea or vomiting.      Past Medical History:  Diagnosis Date  . Ovarian cancer [facility])         Past Surgical History:  Procedure Laterality Date  . ABDOMINAL HYSTERECTOMY     complete  . APPENDECTOMY    . CHOLECYSTECTOMY    . PORT-A-CATH REMOVAL N/A 02/06/2017   Procedure: MINOR REMOVAL PORT-A-CATH; Surgeon: Aviva Signs, MD; Location: [facility]; Service: General; Laterality: N/A; To be done in Minor Room   History reviewed. No pertinent family history.        Current Outpatient Medications on File Prior to Visit  Medication Sig Dispense Refill  . acetaminophen (TYLENOL) 500 MG tablet Take 1,000 mg by mouth 2 (two) times daily as needed for moderate pain or headache.    . ALPRAZolam (XANAX) 0.5 MG tablet Take 0.5 mg by mouth daily as needed for anxiety.    Marland Kitchen escitalopram (LEXAPRO) 10 MG tablet Take 10 mg by mouth daily.  0  . HYDROcodone-acetaminophen (NORCO) 5-325 MG tablet Take 1-2 tablets by mouth every 6 (six) hours as needed for moderate pain. 30 tablet 0  . ibuprofen (ADVIL,MOTRIN) 200 MG tablet Take 800 mg by mouth 2 (two) times daily as needed for headache or moderate pain.      No current facility-administered medications on file prior to visit.         Allergies  Allergen Reactions  . Iohexol Hives and  Shortness Of Breath      . Carboplatin Nausea And Vomiting  . Codeine Nausea And Vomiting  . Iodinated Diagnostic Agents Rash    Ct dye  . Penicillins Hives    Has patient had a PCN reaction causing immediate rash, facial/tongue/throat swelling, SOB or lightheadedness with hypotension: No  Has patient had a PCN reaction causing severe rash involving mucus membranes or skin necrosis: Yes  Has patient had a PCN reaction that required hospitalization Yes  Has patient had a PCN reaction occurring within the last 10 years: No  If all of the above answers are "NO", then may proceed with Cephalosporin use.    Social History      Substance and Sexual Activity  Alcohol Use No   Social History       Tobacco Use  Smoking Status Former Smoker  . Packs/day: 2.00  . Years: 25.00  . Pack years: 50.00  . Types: Cigarettes  . Last attempt to quit: 07/15/2012  . Years since quitting: 5.4  Smokeless Tobacco Never Used   Review of Systems  Constitutional: Negative.  HENT: Negative.  Eyes: Negative.  Respiratory: Negative.  Cardiovascular: Negative.  Gastrointestinal: Positive for blood in stool, heartburn and melena.  Genitourinary: Negative.  Musculoskeletal: Positive for back pain.  Skin: Negative.  Neurological: Positive for dizziness.  Endo/Heme/Allergies: Negative.  Psychiatric/Behavioral: Negative.   Objective     Vitals:   01/09/18 1334  BP: (!) 157/100  Pulse: 83  Temp: 98 F (36.7 C)   Physical Exam  Constitutional: She is oriented to person, place, and time and well-developed, well-nourished, and in no distress.  HENT:  Head: Normocephalic and atraumatic.  Neck: Normal range of motion. Neck supple.  Cardiovascular: Normal rate, regular rhythm and normal heart sounds. Exam reveals no gallop and no friction rub.  No murmur heard.  Pulmonary/Chest: Effort normal and breath sounds normal. No respiratory distress. She has no wheezes. She has no rales.  Abdominal: Soft.  Bowel sounds are normal. She exhibits no distension. There is no tenderness. There is no rebound.  Lymphadenopathy:  She has no cervical adenopathy.  Neurological: She is alert and oriented to person, place, and time.  Skin: Skin is warm and dry.  Vitals reviewed.   Dr. Arcola Jansky notes reviewed  Assessment  Change in bowel habits, melena, family history of colon cancer, dysphasia  Plan  Patient is scheduled for an EGD and colonoscopy on 01/16/2018. The risks and benefits of the procedure including bleeding and perforation were fully explained to the patient, who gave informed consent. GoLYTELY has been prescribed.

## 2018-01-09 NOTE — Progress Notes (Signed)
[name]; [id number]; 1971/05/20   HPI Patient is a 47 year old white female with a history of ovarian carcinoma who was referred to my care by Dr. Quillian Quince for evaluation and treatment of a change in bowel habits as well as dysphasia.  Patient states that her bowels have become darker and are decreased in caliber over the past few months.  She does have a family history of colon carcinoma.  She denies any diarrhea.  She thinks she may have some blood in her stools on occasion.  She currently has no abdominal pain.  She also has had some swallowing difficulties for some time now.  She does have heartburn.  Certain solids get stuck in the midportion of her esophagus.  She denies any nausea or vomiting. Past Medical History:  Diagnosis Date  . Ovarian cancer [facility])     Past Surgical History:  Procedure Laterality Date  . ABDOMINAL HYSTERECTOMY     complete  . APPENDECTOMY    . CHOLECYSTECTOMY    . PORT-A-CATH REMOVAL N/A 02/06/2017   Procedure: MINOR REMOVAL PORT-A-CATH;  Surgeon: Aviva Signs, MD;  Location: [facility];  Service: General;  Laterality: N/A;  To be done in Minor Room    History reviewed. No pertinent family history.  Current Outpatient Medications on File Prior to Visit  Medication Sig Dispense Refill  . acetaminophen (TYLENOL) 500 MG tablet Take 1,000 mg by mouth 2 (two) times daily as needed for moderate pain or headache.    . ALPRAZolam (XANAX) 0.5 MG tablet Take 0.5 mg by mouth daily as needed for anxiety.    Marland Kitchen escitalopram (LEXAPRO) 10 MG tablet Take 10 mg by mouth daily.  0  . HYDROcodone-acetaminophen (NORCO) 5-325 MG tablet Take 1-2 tablets by mouth every 6 (six) hours as needed for moderate pain. 30 tablet 0  . ibuprofen (ADVIL,MOTRIN) 200 MG tablet Take 800 mg by mouth 2 (two) times daily as needed for headache or moderate pain.      No current facility-administered medications on file prior to visit.     Allergies  Allergen Reactions  . Iohexol Hives and  Shortness Of Breath        . Carboplatin Nausea And Vomiting  . Codeine Nausea And Vomiting  . Iodinated Diagnostic Agents Rash    Ct dye  . Penicillins Hives    Has patient had a PCN reaction causing immediate rash, facial/tongue/throat swelling, SOB or lightheadedness with hypotension: No Has patient had a PCN reaction causing severe rash involving mucus membranes or skin necrosis: Yes Has patient had a PCN reaction that required hospitalization Yes Has patient had a PCN reaction occurring within the last 10 years: No If all of the above answers are "NO", then may proceed with Cephalosporin use.     Social History   Substance and Sexual Activity  Alcohol Use No    Social History   Tobacco Use  Smoking Status Former Smoker  . Packs/day: 2.00  . Years: 25.00  . Pack years: 50.00  . Types: Cigarettes  . Last attempt to quit: 07/15/2012  . Years since quitting: 5.4  Smokeless Tobacco Never Used    Review of Systems  Constitutional: Negative.   HENT: Negative.   Eyes: Negative.   Respiratory: Negative.   Cardiovascular: Negative.   Gastrointestinal: Positive for blood in stool, heartburn and melena.  Genitourinary: Negative.   Musculoskeletal: Positive for back pain.  Skin: Negative.   Neurological: Positive for dizziness.  Endo/Heme/Allergies: Negative.   Psychiatric/Behavioral: Negative.  Objective   Vitals:   01/09/18 1334  BP: (!) 157/100  Pulse: 83  Temp: 98 F (36.7 C)    Physical Exam  Constitutional: She is oriented to person, place, and time and well-developed, well-nourished, and in no distress.  HENT:  Head: Normocephalic and atraumatic.  Neck: Normal range of motion. Neck supple.  Cardiovascular: Normal rate, regular rhythm and normal heart sounds. Exam reveals no gallop and no friction rub.  No murmur heard. Pulmonary/Chest: Effort normal and breath sounds normal. No respiratory distress. She has no wheezes. She has no rales.   Abdominal: Soft. Bowel sounds are normal. She exhibits no distension. There is no tenderness. There is no rebound.  Lymphadenopathy:    She has no cervical adenopathy.  Neurological: She is alert and oriented to person, place, and time.  Skin: Skin is warm and dry.  Vitals reviewed.  Dr. Arcola Jansky notes reviewed Assessment  Change in bowel habits, melena, family history of colon cancer, dysphasia Plan   Patient is scheduled for an EGD and colonoscopy on 01/16/2018.  The risks and benefits of the procedure including bleeding and perforation were fully explained to the patient, who gave informed consent.  GoLYTELY has been prescribed.

## 2018-01-09 NOTE — Patient Instructions (Addendum)
Colonoscopy, Adult A colonoscopy is an exam to look at the entire large intestine. During the exam, a lubricated, bendable tube is inserted into the anus and then passed into the rectum, colon, and other parts of the large intestine. A colonoscopy is often done as a part of normal colorectal screening or in response to certain symptoms, such as anemia, persistent diarrhea, abdominal pain, and blood in the stool. The exam can help screen for and diagnose medical problems, including:  Tumors.  Polyps.  Inflammation.  Areas of bleeding.  Tell a health care provider about:  Any allergies you have.  All medicines you are taking, including vitamins, herbs, eye drops, creams, and over-the-counter medicines.  Any problems you or family members have had with anesthetic medicines.  Any blood disorders you have.  Any surgeries you have had.  Any medical conditions you have.  Any problems you have had passing stool. What are the risks? Generally, this is a safe procedure. However, problems may occur, including:  Bleeding.  A tear in the intestine.  A reaction to medicines given during the exam.  Infection (rare).  What happens before the procedure? Eating and drinking restrictions Follow instructions from your health care provider about eating and drinking, which may include:  A few days before the procedure - follow a low-fiber diet. Avoid nuts, seeds, dried fruit, raw fruits, and vegetables.  1-3 days before the procedure - follow a clear liquid diet. Drink only clear liquids, such as clear broth or bouillon, black coffee or tea, clear juice, clear soft drinks or sports drinks, gelatin dessert, and popsicles. Avoid any liquids that contain red or purple dye.  On the day of the procedure - do not eat or drink anything during the 2 hours before the procedure, or within the time period that your health care provider recommends.  Bowel prep If you were prescribed an oral bowel prep  to clean out your colon:  Take it as told by your health care provider. Starting the day before your procedure, you will need to drink a large amount of medicated liquid. The liquid will cause you to have multiple loose stools until your stool is almost clear or light green.  If your skin or anus gets irritated from diarrhea, you may use these to relieve the irritation: ? Medicated wipes, such as adult wet wipes with aloe and vitamin E. ? A skin soothing-product like petroleum jelly.  If you vomit while drinking the bowel prep, take a break for up to 60 minutes and then begin the bowel prep again. If vomiting continues and you cannot take the bowel prep without vomiting, call your health care provider.  General instructions  Ask your health care provider about changing or stopping your regular medicines. This is especially important if you are taking diabetes medicines or blood thinners.  Plan to have someone take you home from the hospital or clinic. What happens during the procedure?  An IV tube may be inserted into one of your veins.  You will be given medicine to help you relax (sedative).  To reduce your risk of infection: ? Your health care team will wash or sanitize their hands. ? Your anal area will be washed with soap.  You will be asked to lie on your side with your knees bent.  Your health care provider will lubricate a long, thin, flexible tube. The tube will have a camera and a light on the end.  The tube will be inserted into your   anus.  The tube will be gently eased through your rectum and colon.  Air will be delivered into your colon to keep it open. You may feel some pressure or cramping.  The camera will be used to take images during the procedure.  A small tissue sample may be removed from your body to be examined under a microscope (biopsy). If any potential problems are found, the tissue will be sent to a lab for testing.  If small polyps are found, your  health care provider may remove them and have them checked for cancer cells.  The tube that was inserted into your anus will be slowly removed. The procedure may vary among health care providers and hospitals. What happens after the procedure?  Your blood pressure, heart rate, breathing rate, and blood oxygen level will be monitored until the medicines you were given have worn off.  Do not drive for 24 hours after the exam.  You may have a small amount of blood in your stool.  You may pass gas and have mild abdominal cramping or bloating due to the air that was used to inflate your colon during the exam.  It is up to you to get the results of your procedure. Ask your health care provider, or the department performing the procedure, when your results will be ready. This information is not intended to replace advice given to you by your health care provider. Make sure you discuss any questions you have with your health care provider. Document Released: 11/11/2000 Document Revised: 09/14/2016 Document Reviewed: 01/26/2016 Elsevier Interactive Patient Education  2018 [employer] Inc[employer]    Upper Endoscopy Upper endoscopy is a procedure to look inside the upper GI (gastrointestinal) tract. The upper GI tract is made up of:  The tube that carries food and liquid from your throat to your stomach (esophagus).  The stomach.  The first part of your small intestine (duodenum).  This procedure is also called esophagogastroduodenoscopy (EGD) or gastroscopy. In this procedure, your health care provider passes a thin, flexible tube (endoscope) through your mouth and down your esophagus into your stomach. A small camera is attached to the end of the tube. Images from the camera appear on a monitor in the exam room. During this procedure, your health care provider may also remove a small piece of tissue to be sent to a lab and examined under a microscope (biopsy). Your health care provider may do an upper  endoscopy to diagnose cancers of the upper GI tract. You may also have this procedure to find the cause of other conditions, such as:  Stomach pain.  Heartburn.  Pain or problems when swallowing.  Nausea and vomiting.  Stomach bleeding.  Stomach ulcers.  Tell a health care provider about:  Any allergies you have.  All medicines you are taking, including vitamins, herbs, eye drops, creams, and over-the-counter medicines.  Any problems you or family members have had with anesthetic medicines.  Any blood disorders you have.  Any surgeries you have had.  Any medical conditions you have.  Whether you are pregnant or may be pregnant. What are the risks? Generally, this is a safe procedure. However, problems may occur, including:  Infection.  Bleeding.  Allergic reactions to medicines.  A tear or hole (perforation) in the esophagus, stomach, or duodenum.  What happens before the procedure?  Follow instructions from your health care provider about eating or drinking restrictions.  Ask your health care provider about changing or stopping your regular medicines. This   is especially important if you are taking diabetes medicines or blood thinners.  Plan to have someone take you home after the procedure.  If you go home right after the procedure, plan to have someone with you for 24 hours. What happens during the procedure?  An IV tube will be inserted into one of your veins.  Your throat may be sprayed with medicine that numbs the area (local anesthetic).  You may be given a medicine to help you relax (sedative).  You will lie on your left side.  Your health care provider will pass the endoscope through your mouth and down your esophagus.  Your provider will use the scope to check the inside of your esophagus, stomach, and duodenum. Biopsies may be taken. The procedure may vary among health care providers and hospitals. What happens after the procedure?  Do not  drive for 24 hours if you received a sedative.  Your blood pressure, heart rate, breathing rate, and blood oxygen level will be monitored often until the medicines you were given have worn off.  When your throat is no longer numb, you may be given some fluids to drink.  It is your responsibility to get the results of your procedure. Ask your health care provider or the department performing the procedure when your results will be ready. This information is not intended to replace advice given to you by your health care provider. Make sure you discuss any questions you have with your health care provider. Document Released: 11/11/2000 Document Revised: 04/26/2016 Document Reviewed: 08/27/2015 Elsevier Interactive Patient Education  2018 Elsevier Inc.  

## 2018-01-16 ENCOUNTER — Ambulatory Visit (HOSPITAL_COMMUNITY)
Admission: RE | Admit: 2018-01-16 | Discharge: 2018-01-16 | Disposition: A | Discharge status: Home / Self Care | Payer: 59 | Source: Ambulatory Visit | Attending: General Surgery | Admitting: General Surgery

## 2018-01-16 ENCOUNTER — Encounter (HOSPITAL_COMMUNITY): Payer: Self-pay

## 2018-01-16 ENCOUNTER — Other Ambulatory Visit: Payer: Self-pay

## 2018-01-16 ENCOUNTER — Encounter (HOSPITAL_COMMUNITY)
Admission: RE | Disposition: A | Discharge status: Home / Self Care | Payer: Self-pay | Source: Ambulatory Visit | Attending: General Surgery

## 2018-01-16 DIAGNOSIS — Z8543 Personal history of malignant neoplasm of ovary: Secondary | ICD-10-CM | POA: Diagnosis not present

## 2018-01-16 DIAGNOSIS — R195 Other fecal abnormalities: Secondary | ICD-10-CM | POA: Insufficient documentation

## 2018-01-16 DIAGNOSIS — Z87891 Personal history of nicotine dependence: Secondary | ICD-10-CM | POA: Diagnosis not present

## 2018-01-16 DIAGNOSIS — Z9079 Acquired absence of other genital organ(s): Secondary | ICD-10-CM | POA: Diagnosis not present

## 2018-01-16 DIAGNOSIS — Z888 Allergy status to other drugs, medicaments and biological substances status: Secondary | ICD-10-CM | POA: Diagnosis not present

## 2018-01-16 DIAGNOSIS — R1310 Dysphagia, unspecified: Secondary | ICD-10-CM

## 2018-01-16 DIAGNOSIS — R194 Change in bowel habit: Secondary | ICD-10-CM | POA: Insufficient documentation

## 2018-01-16 DIAGNOSIS — Z880 Allergy status to penicillin: Secondary | ICD-10-CM | POA: Insufficient documentation

## 2018-01-16 DIAGNOSIS — Z885 Allergy status to narcotic agent status: Secondary | ICD-10-CM | POA: Insufficient documentation

## 2018-01-16 DIAGNOSIS — Z79899 Other long term (current) drug therapy: Secondary | ICD-10-CM | POA: Diagnosis not present

## 2018-01-16 DIAGNOSIS — R4702 Dysphasia: Secondary | ICD-10-CM | POA: Insufficient documentation

## 2018-01-16 DIAGNOSIS — Z800 Family history of malignant neoplasm of digestive organs: Secondary | ICD-10-CM | POA: Diagnosis not present

## 2018-01-16 DIAGNOSIS — Z8371 Family history of colonic polyps: Secondary | ICD-10-CM | POA: Diagnosis not present

## 2018-01-16 HISTORY — PX: BIOPSY: SHX5522

## 2018-01-16 HISTORY — DX: Anxiety disorder, unspecified: F41.9

## 2018-01-16 HISTORY — PX: ESOPHAGOGASTRODUODENOSCOPY: SHX5428

## 2018-01-16 HISTORY — DX: Gastro-esophageal reflux disease without esophagitis: K21.9

## 2018-01-16 HISTORY — PX: COLONOSCOPY: SHX5424

## 2018-01-16 SURGERY — COLONOSCOPY
Anesthesia: Moderate Sedation

## 2018-01-16 MED ORDER — SODIUM CHLORIDE 0.9 % IV SOLN
INTRAVENOUS | Status: DC
  Administered 2018-01-16: 08:00:00 via INTRAVENOUS

## 2018-01-16 MED ORDER — MIDAZOLAM HCL 5 MG/5ML IJ SOLN
INTRAMUSCULAR | Status: AC
  Filled 2018-01-16: qty 5

## 2018-01-16 MED ORDER — STERILE WATER FOR IRRIGATION IR SOLN
Status: DC | PRN
  Administered 2018-01-16: 08:00:00

## 2018-01-16 MED ORDER — MIDAZOLAM HCL 5 MG/5ML IJ SOLN
INTRAMUSCULAR | Status: DC | PRN
  Administered 2018-01-16: 1 mg via INTRAVENOUS
  Administered 2018-01-16: 4 mg via INTRAVENOUS

## 2018-01-16 MED ORDER — MEPERIDINE HCL 50 MG/ML IJ SOLN
INTRAMUSCULAR | Status: AC
  Filled 2018-01-16: qty 1

## 2018-01-16 MED ORDER — BUTAMBEN-TETRACAINE-BENZOCAINE 2-2-14 % EX AERO
INHALATION_SPRAY | CUTANEOUS | Status: DC | PRN
  Administered 2018-01-16: 2 via TOPICAL

## 2018-01-16 MED ORDER — BUTAMBEN-TETRACAINE-BENZOCAINE 2-2-14 % EX AERO
INHALATION_SPRAY | CUTANEOUS | Status: AC
  Filled 2018-01-16: qty 5

## 2018-01-16 MED ORDER — MEPERIDINE HCL 50 MG/ML IJ SOLN
INTRAMUSCULAR | Status: DC | PRN
  Administered 2018-01-16: 25 mg via INTRAVENOUS
  Administered 2018-01-16: 50 mg via INTRAVENOUS

## 2018-01-16 NOTE — Interval H&P Note (Signed)
History and Physical Interval Note:  01/16/2018 7:49 AM  [name]  has presented today for surgery, with the diagnosis of dysphagia,family history of colon cancer  The various methods of treatment have been discussed with the patient and family. After consideration of risks, benefits and other options for treatment, the patient has consented to  Procedure(s): COLONOSCOPY (N/A) ESOPHAGOGASTRODUODENOSCOPY (EGD) (N/A) BIOPSY (N/A) as a surgical intervention .  The patient's history has been reviewed, patient examined, no change in status, stable for surgery.  I have reviewed the patient's chart and labs.  Questions were answered to the patient's satisfaction.     Aviva Signs

## 2018-01-16 NOTE — Discharge Instructions (Signed)
Esophagogastroduodenoscopy, Care After Refer to this sheet in the next few weeks. These instructions provide you with information about caring for yourself after your procedure. Your health care provider may also give you more specific instructions. Your treatment has been planned according to current medical practices, but problems sometimes occur. Call your health care provider if you have any problems or questions after your procedure.  What can I expect after the procedure? After the procedure, it is common to have:  A sore throat.  Nausea.  Bloating.  Dizziness.  Fatigue.  Follow these instructions at home:  Do not eat or drink anything until the numbing medicine (local anesthetic) has worn off and your gag reflex has returned. You will know that the local anesthetic has worn off when you can swallow comfortably.  Do not drive for 24 hours if you received a medicine to help you relax (sedative).  If your health care provider took a tissue sample for testing during the procedure, make sure to get your test results. This is your responsibility. Ask your health care provider or the department performing the test when your results will be ready.  Keep all follow-up visits as told by your health care provider. This is important.   Contact a health care provider if:  You cannot stop coughing.  You are not urinating.  You are urinating less than usual.   Get help right away if:  You have trouble swallowing.  You cannot eat or drink.  You have throat or chest pain that gets worse.  You are dizzy or light-headed.  You faint.  You have nausea or vomiting.  You have chills.  You have a fever.  You have severe abdominal pain.  You have black, tarry, or bloody stools. This information is not intended to replace advice given to you by your health care provider. Make sure you discuss any questions you have with your health care provider.    Colonoscopy, Adult, Care  After This sheet gives you information about how to care for yourself after your procedure. Your health care provider may also give you more specific instructions. If you have problems or questions, contact your health care provider. What can I expect after the procedure? After the procedure, it is common to have:  A small amount of blood in your stool for 24 hours after the procedure.  Some gas.  Mild abdominal cramping or bloating.  Follow these instructions at home: General instructions   For the first 24 hours after the procedure: ? Do not drive or use machinery. ? Do not sign important documents. ? Do not drink alcohol. ? Do your regular daily activities at a slower pace than normal. ? Eat soft, easy-to-digest foods. ? Rest often.  Take over-the-counter or prescription medicines only as told by your health care provider.  It is up to you to get the results of your procedure. Ask your health care provider, or the department performing the procedure, when your results will be ready.   Relieving cramping and bloating  Try walking around when you have cramps or feel bloated.  Apply heat to your abdomen as told by your health care provider. Use a heat source that your health care provider recommends, such as a moist heat pack or a heating pad. ? Place a towel between your skin and the heat source. ? Leave the heat on for 20-30 minutes. ? Remove the heat if your skin turns bright red. This is especially important if you are  unable to feel pain, heat, or cold. You may have a greater risk of getting burned. ?  Eating and drinking  Drink enough fluid to keep your urine clear or pale yellow.  Resume your normal diet as instructed by your health care provider. Avoid heavy or fried foods that are hard to digest.  Avoid drinking alcohol for as long as instructed by your health care provider.   Contact a health care provider if:  You have blood in your stool 2-3 days after the  procedure.   Get help right away if:  You have more than a small spotting of blood in your stool.  You pass large blood clots in your stool.  Your abdomen is swollen.  You have nausea or vomiting.  You have a fever.  You have increasing abdominal pain that is not relieved with medicine.   This information is not intended to replace advice given to you by your health care provider. Make sure you discuss any questions you have with your health care provider.

## 2018-01-16 NOTE — Op Note (Signed)
[facility] Patient Name: [name] Procedure Date: 01/16/2018 7:49 AM MRN: [id number] Date of Birth: 1971/04/01 Attending MD: Aviva Signs , MD CSN: [id number] Age: 47 Admit Type: Outpatient Procedure:                Colonoscopy Indications:              Colon cancer screening in patient with 1st-degree                            relative having advanced adenoma of the colon Providers:                Aviva Signs, MD, Otis Peak B. Gwenlyn Perking RN, RN, Aram Candela Referring MD:              Medicines:                Midazolam 5 mg IV, Meperidine 75 mg IV Complications:            No immediate complications. Estimated blood loss:                            None. Estimated Blood Loss:     Estimated blood loss: none. Procedure:                Pre-Anesthesia Assessment:                           - Prior to the procedure, a History and Physical                            was performed, and patient medications and                            allergies were reviewed. The patient is competent.                            The risks and benefits of the procedure and the                            sedation options and risks were discussed with the                            patient. All questions were answered and informed                            consent was obtained. Patient identification and                            proposed procedure were verified by the physician,                            the nurse and the technician in the procedure room.  Mental Status Examination: alert and oriented.                            Airway Examination: normal oropharyngeal airway and                            neck mobility. Respiratory Examination: clear to                            auscultation. CV Examination: RRR, no murmurs, no                            S3 or S4. Prophylactic Antibiotics: The patient                            does not require  prophylactic antibiotics. Prior                            Anticoagulants: The patient has taken no previous                            anticoagulant or antiplatelet agents. ASA Grade                            Assessment: II - A patient with mild systemic                            disease. After reviewing the risks and benefits,                            the patient was deemed in satisfactory condition to                            undergo the procedure. The anesthesia plan was to                            use moderate sedation / analgesia (conscious                            sedation). Immediately prior to administration of                            medications, the patient was re-assessed for                            adequacy to receive sedatives. The heart rate,                            respiratory rate, oxygen saturations, blood                            pressure, adequacy of pulmonary ventilation, and  response to care were monitored throughout the                            procedure. The physical status of the patient was                            re-assessed after the procedure.                           After obtaining informed consent, the colonoscope                            was passed under direct vision. Throughout the                            procedure, the patient's blood pressure, pulse, and                            oxygen saturations were monitored continuously. The                            [id number] ([id number]) scope was introduced through                            the anus and advanced to the the cecum, identified                            by the appendiceal orifice, ileocecal valve and                            palpation. No anatomical landmarks were                            photographed. The entire colon was well visualized.                            The colonoscopy was performed with ease. The                             patient tolerated the procedure well. The quality                            of the bowel preparation was adequate. The total                            duration of the procedure was 12 minutes. Scope In: 8:59:04 AM Scope Out: 8:09:44 AM Scope Withdrawal Time: 0 hours 2 minutes 49 seconds  Findings:      The entire examined colon appeared normal on direct and retroflexion       views. Impression:               - The entire examined colon is normal on direct and  retroflexion views.                           - No specimens collected. Moderate Sedation:      Moderate (conscious) sedation was administered by the endoscopy nurse       and supervised by the endoscopist. The following parameters were       monitored: oxygen saturation, heart rate, blood pressure, and response       to care. Recommendation:           - Written discharge instructions were provided to                            the patient.                           - The signs and symptoms of potential delayed                            complications were discussed with the patient.                           - Patient has a contact number available for                            emergencies.                           - Return to normal activities tomorrow.                           - Resume previous diet.                           - Continue present medications.                           - Repeat colonoscopy in 5 years for screening                            purposes. Procedure Code(s):        --- Professional ---                           P6195, Colorectal cancer screening; colonoscopy on                            individual at high risk Diagnosis Code(s):        --- Professional ---                           Z80.0, Family history of malignant neoplasm of                            digestive organs CPT copyright 2016 [employer]. All rights reserved. The codes documented in  this report are preliminary and upon coder review may  be revised to meet current compliance requirements. Aviva Signs, MD Elta Guadeloupe  Arnoldo Morale, MD 01/16/2018 8:22:21 AM This report has been signed electronically. Number of Addenda: 0

## 2018-01-16 NOTE — Op Note (Signed)
[facility] Patient Name: [name] Procedure Date: 01/16/2018 8:09 AM MRN: [id number] Date of Birth: 16-Oct-1971 Attending MD: Aviva Signs , MD CSN: [id number] Age: 47 Admit Type: Outpatient Procedure:                Upper GI endoscopy Indications:              Dysphagia Providers:                Aviva Signs, MD, Otis Peak B. Gwenlyn Perking RN, RN, Aram Candela Referring MD:              Medicines:                Midazolam 5 mg IV, Meperidine 75 mg IV, Cetacaine                            spray Complications:            No immediate complications. Estimated blood loss:                            None. Estimated Blood Loss:     Estimated blood loss: none. Procedure:                Pre-Anesthesia Assessment:                           - Prior to the procedure, a History and Physical                            was performed, and patient medications and                            allergies were reviewed. The patient is competent.                            The risks and benefits of the procedure and the                            sedation options and risks were discussed with the                            patient. All questions were answered and informed                            consent was obtained. Patient identification and                            proposed procedure were verified by the physician,                            the nurse and the technician in the procedure room.                            Mental Status  Examination: alert and oriented.                            Airway Examination: normal oropharyngeal airway and                            neck mobility. Respiratory Examination: clear to                            auscultation. CV Examination: RRR, no murmurs, no                            S3 or S4. Prophylactic Antibiotics: The patient                            does not require prophylactic antibiotics. Prior   Anticoagulants: The patient has taken no previous                            anticoagulant or antiplatelet agents. ASA Grade                            Assessment: II - A patient with mild systemic                            disease. After reviewing the risks and benefits,                            the patient was deemed in satisfactory condition to                            undergo the procedure. The anesthesia plan was to                            use moderate sedation / analgesia (conscious                            sedation). Immediately prior to administration of                            medications, the patient was re-assessed for                            adequacy to receive sedatives. The heart rate,                            respiratory rate, oxygen saturations, blood                            pressure, adequacy of pulmonary ventilation, and                            response to care were monitored throughout the  procedure. The physical status of the patient was                            re-assessed after the procedure.                           - Prior to the procedure, a History and Physical                            was performed, and patient medications and                            allergies were reviewed. The patient is competent.                            The risks and benefits of the procedure and the                            sedation options and risks were discussed with the                            patient. All questions were answered and informed                            consent was obtained. Patient identification and                            proposed procedure were verified by the physician,                            the nurse and the technician in the procedure room.                            Mental Status Examination: alert and oriented.                            Airway Examination: normal oropharyngeal airway and                             neck mobility. Respiratory Examination: clear to                            auscultation. CV Examination: RRR, no murmurs, no                            S3 or S4. Prophylactic Antibiotics: The patient                            does not require prophylactic antibiotics. Prior                            Anticoagulants: The patient has taken no previous  anticoagulant or antiplatelet agents. ASA Grade                            Assessment: II - A patient with mild systemic                            disease. After reviewing the risks and benefits,                            the patient was deemed in satisfactory condition to                            undergo the procedure. The anesthesia plan was to                            use moderate sedation / analgesia (conscious                            sedation). Immediately prior to administration of                            medications, the patient was re-assessed for                            adequacy to receive sedatives. The heart rate,                            respiratory rate, oxygen saturations, blood                            pressure, adequacy of pulmonary ventilation, and                            response to care were monitored throughout the                            procedure. The physical status of the patient was                            re-assessed after the procedure.                           After obtaining informed consent, the endoscope was                            passed under direct vision. Throughout the                            procedure, the patient's blood pressure, pulse, and                            oxygen saturations were monitored continuously. The  [id number] ([id number]) scope was introduced through the                            mouth, and advanced to the second part of duodenum.                            The upper GI  endoscopy was accomplished with ease.                            The patient tolerated the procedure well. Scope In: 8:12:58 AM Scope Out: 8:16:09 AM Total Procedure Duration: 0 hours 3 minutes 11 seconds  Findings:      The in the duodenum was normal.      The gastric antrum was normal. Biopsies were taken with a cold forceps       for Helicobacter pylori testing using CLOtest. Estimated blood loss:       none.      The exam was otherwise without abnormality. No ulceration, hiatal hernia       noted. Z line at 40cm from teeth. Normal esphagus. Impression:               - Normal.                           - Normal antrum. Biopsied.                           - The examination was otherwise normal. Moderate Sedation:      Moderate (conscious) sedation was administered by the endoscopy nurse       and supervised by the endoscopist. The following parameters were       monitored: oxygen saturation, heart rate, blood pressure, and response       to care. Recommendation:           - Written discharge instructions were provided to                            the patient.                           - The signs and symptoms of potential delayed                            complications were discussed with the patient.                           - Patient has a contact number available for                            emergencies.                           - Return to normal activities tomorrow.                           - Resume previous diet.                           -  Continue present medications.                           - Await pathology results. Procedure Code(s):        --- Professional ---                           [phone], Esophagogastroduodenoscopy, flexible,                            transoral; with biopsy, single or multiple Diagnosis Code(s):        --- Professional ---                           R13.10, Dysphagia, unspecified CPT copyright 2016 [employer]. All rights  reserved. The codes documented in this report are preliminary and upon coder review may  be revised to meet current compliance requirements. Aviva Signs, MD Aviva Signs, MD 01/16/2018 8:27:54 AM This report has been signed electronically. Number of Addenda: 0

## 2018-01-17 LAB — CLOTEST (H. PYLORI), BIOPSY: Helicobacter screen: NEGATIVE

## 2018-01-19 ENCOUNTER — Encounter (HOSPITAL_COMMUNITY): Payer: Self-pay | Admitting: General Surgery

## 2018-12-19 ENCOUNTER — Other Ambulatory Visit (HOSPITAL_COMMUNITY): Payer: 59

## 2018-12-21 ENCOUNTER — Other Ambulatory Visit (HOSPITAL_COMMUNITY): Payer: 59

## 2018-12-26 ENCOUNTER — Ambulatory Visit (HOSPITAL_COMMUNITY): Payer: 59 | Admitting: Internal Medicine

## 2018-12-28 ENCOUNTER — Ambulatory Visit (HOSPITAL_COMMUNITY): Payer: 59 | Admitting: Hematology

## 2018-12-28 ENCOUNTER — Ambulatory Visit (HOSPITAL_COMMUNITY): Payer: 59 | Admitting: Internal Medicine

## 2019-01-04 ENCOUNTER — Other Ambulatory Visit (HOSPITAL_COMMUNITY): Payer: Self-pay | Admitting: Emergency Medicine

## 2019-01-04 DIAGNOSIS — C561 Malignant neoplasm of right ovary: Secondary | ICD-10-CM

## 2019-01-07 ENCOUNTER — Inpatient Hospital Stay (HOSPITAL_COMMUNITY): Payer: BLUE CROSS/BLUE SHIELD | Attending: Hematology

## 2019-01-07 DIAGNOSIS — C561 Malignant neoplasm of right ovary: Secondary | ICD-10-CM

## 2019-01-07 DIAGNOSIS — Z8543 Personal history of malignant neoplasm of ovary: Secondary | ICD-10-CM | POA: Diagnosis present

## 2019-01-07 DIAGNOSIS — Z8542 Personal history of malignant neoplasm of other parts of uterus: Secondary | ICD-10-CM | POA: Insufficient documentation

## 2019-01-07 LAB — COMPREHENSIVE METABOLIC PANEL
ALBUMIN: 4.2 g/dL (ref 3.5–5.0)
ALK PHOS: 89 U/L (ref 38–126)
ALT: 22 U/L (ref 0–44)
ANION GAP: 8 (ref 5–15)
AST: 20 U/L (ref 15–41)
BILIRUBIN TOTAL: 0.3 mg/dL (ref 0.3–1.2)
BUN: 13 mg/dL (ref 6–20)
CHLORIDE: 103 mmol/L (ref 98–111)
CO2: 26 mmol/L (ref 22–32)
CREATININE: 0.63 mg/dL (ref 0.44–1.00)
Calcium: 9.2 mg/dL (ref 8.9–10.3)
GFR calc Af Amer: >60 mL/min (ref >60)
GFR calc non Af Amer: >60 mL/min (ref >60)
Glucose, Bld: 87 mg/dL (ref 70–99)
Potassium: 4.4 mmol/L (ref 3.5–5.1)
Sodium: 137 mmol/L (ref 135–145)
Total Protein: 7.6 g/dL (ref 6.5–8.1)

## 2019-01-07 LAB — CBC WITH DIFFERENTIAL/PLATELET
Abs Immature Granulocytes: 0.02 10*3/uL (ref 0.00–0.07)
BASOS ABS: 0.1 10*3/uL (ref 0.0–0.1)
Basophils Relative: 1 %
EOS ABS: 0.2 10*3/uL (ref 0.0–0.5)
Eosinophils Relative: 2 %
HEMATOCRIT: 46.1 % — AB (ref 36.0–46.0)
HEMOGLOBIN: 14.4 g/dL (ref 12.0–15.0)
Immature Granulocytes: 0 %
Lymphocytes Relative: 25 %
Lymphs Abs: 2.5 10*3/uL (ref 0.7–4.0)
MCH: 28.6 pg (ref 26.0–34.0)
MCHC: 31.2 g/dL (ref 30.0–36.0)
MCV: 91.5 fL (ref 80.0–100.0)
MONOS PCT: 8 %
Monocytes Absolute: 0.8 10*3/uL (ref 0.1–1.0)
NEUTROS ABS: 6.3 10*3/uL (ref 1.7–7.7)
NEUTROS PCT: 64 %
NRBC: 0 % (ref 0.0–0.2)
Platelets: 266 10*3/uL (ref 150–400)
RBC: 5.04 MIL/uL (ref 3.87–5.11)
RDW: 13.4 % (ref 11.5–15.5)
WBC: 9.8 10*3/uL (ref 4.0–10.5)

## 2019-01-08 LAB — CA 125: CANCER ANTIGEN (CA) 125: 8.9 U/mL (ref 0.0–38.1)

## 2019-01-14 ENCOUNTER — Ambulatory Visit (HOSPITAL_COMMUNITY): Payer: Self-pay | Admitting: Internal Medicine
